# Patient Record
Sex: Female | Born: 1950
Health system: Southern US, Community
[De-identification: ages and names within clinical notes are randomized; demographics above are authoritative.]

## PROBLEM LIST (undated history)

## (undated) DIAGNOSIS — N6019 Diffuse cystic mastopathy of unspecified breast: Secondary | ICD-10-CM

## (undated) DIAGNOSIS — B009 Herpesviral infection, unspecified: Secondary | ICD-10-CM

## (undated) DIAGNOSIS — M1990 Unspecified osteoarthritis, unspecified site: Secondary | ICD-10-CM

## (undated) HISTORY — DX: Herpesviral infection, unspecified: B00.9

## (undated) HISTORY — DX: Diffuse cystic mastopathy of unspecified breast: N60.19

## (undated) HISTORY — DX: Unspecified osteoarthritis, unspecified site: M19.90

---
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
## 1987-10-14 HISTORY — PX: BREAST EXCISIONAL BIOPSY: SUR124

## 1992-10-13 DIAGNOSIS — B009 Herpesviral infection, unspecified: Secondary | ICD-10-CM

## 1992-10-13 HISTORY — DX: Herpesviral infection, unspecified: B00.9

## 1994-10-13 HISTORY — PX: OTHER SURGICAL HISTORY: SHX169

## 1995-01-12 HISTORY — PX: ABDOMINAL HYSTERECTOMY: SHX81

## 2000-10-13 HISTORY — PX: OTHER SURGICAL HISTORY: SHX169

## 2011-06-19 ENCOUNTER — Encounter: Payer: Self-pay | Admitting: Family Medicine

## 2011-06-19 ENCOUNTER — Ambulatory Visit (INDEPENDENT_AMBULATORY_CARE_PROVIDER_SITE_OTHER): Payer: BC Managed Care – PPO | Admitting: Family Medicine

## 2011-06-19 VITALS — BP 137/85 | HR 76 | Ht 65.0 in | Wt 138.0 lb

## 2011-06-19 DIAGNOSIS — Z720 Tobacco use: Secondary | ICD-10-CM | POA: Insufficient documentation

## 2011-06-19 DIAGNOSIS — M129 Arthropathy, unspecified: Secondary | ICD-10-CM

## 2011-06-19 DIAGNOSIS — Z1231 Encounter for screening mammogram for malignant neoplasm of breast: Secondary | ICD-10-CM

## 2011-06-19 DIAGNOSIS — Z23 Encounter for immunization: Secondary | ICD-10-CM

## 2011-06-19 DIAGNOSIS — M1990 Unspecified osteoarthritis, unspecified site: Secondary | ICD-10-CM | POA: Insufficient documentation

## 2011-06-19 DIAGNOSIS — Z13220 Encounter for screening for lipoid disorders: Secondary | ICD-10-CM

## 2011-06-19 DIAGNOSIS — F17200 Nicotine dependence, unspecified, uncomplicated: Secondary | ICD-10-CM

## 2011-06-19 NOTE — Progress Notes (Signed)
Subjective:    Patient ID: Alison Wilson, female    DOB: 04-18-1951, 60 y.o.   MRN: [id number]  HPI  Here to estab care. She has multiple concerns she would like to discuss today.   Has concerns about the arthritis in her fingers.  Pain over the PIPs.  She was adopted so not sure about family hx.  No swelling or redness. She does a lot of yard work.    She has been the primary care giver for her parents who are now passed away. So she admits that she hasn't really been taking care of herself fairly well. Therefore she does feel she eats a healthy diet does get some regular exercise.  Hx of breast fibroids.  Has had normal breast cyst apiration. Last mammo in 2002.   She feels she is generally in good health.  She does want to quit smoking. She uses it for stress relief.  No alcohol use. Started age 47.  Husband has now quit.  She has tried to quit about 4 times in the past.    Review of Systems  Constitutional: Negative for fever, diaphoresis and unexpected weight change.  HENT: Negative for hearing loss, sneezing, postnasal drip and tinnitus.   Eyes: Negative for visual disturbance.  Respiratory: Negative for cough and wheezing.   Cardiovascular: Negative for chest pain and palpitations.  Genitourinary: Negative for vaginal bleeding and vaginal discharge.       Occasional stress incontinence with coughing and sneezing.  Musculoskeletal: Negative for arthralgias.  Neurological: Negative for headaches.  Hematological: Negative for adenopathy. Does not bruise/bleed easily.       BP 137/85  Pulse 76  Ht 5\' 5"  (1.651 m)  Wt 138 lb (62.596 kg)  BMI 22.96 kg/m2    Allergies  Allergen Reactions  . Keflex     Past Medical History  Diagnosis Date  . Herpes 1994    Past Surgical History  Procedure Date  . Abdominal hysterectomy 01/1995    fibroid, still has her ovaries.   . Tonsillectomy and adenoidectomy 1957  . Right breast biopsy 2002    History   Social History  .  Marital Status: Married    Spouse Name: [name]    Number of Children: 2  . Years of Education: N/A   Occupational History  . Retired    Social History Main Topics  . Smoking status: Current Everyday Smoker -- 1.0 packs/day    Types: Cigarettes  . Smokeless tobacco: Not on file  . Alcohol Use: No  . Drug Use: No  . Sexually Active: Not Currently   Other Topics Concern  . Not on file   Social History Narrative  . No narrative on file    History reviewed. No pertinent family history.  [name] does not currently have medications on file.  Objective:   Physical Exam  Constitutional: She is oriented to person, place, and time. She appears well-developed and well-nourished.  HENT:  Head: Normocephalic and atraumatic.  Cardiovascular: Normal rate, regular rhythm and normal heart sounds.        No carotid bruits  Pulmonary/Chest: Effort normal and breath sounds normal.  Neurological: She is alert and oriented to person, place, and time.  Skin: Skin is warm and dry.  Psychiatric: She has a normal mood and affect. Her behavior is normal.          Assessment & Plan:  Arthritis - Recommend use Tylenol prn    tobacoo Abuse-Discussed stratgies such as  nicotine replacemen and chantix. She is most interested in the patch.  We discussed how she starts with probably a 7 mcg notch and then taper down over one to 2 months. Also recommended the weight 100 quit now program which is free.  Vaccines - Recommend Tdap today and flu shot.  Screening breast cancer- Willl schedule for mammogam/

## 2011-06-19 NOTE — Patient Instructions (Signed)
[phone]-[email or web address]-NOW We will call you with your lab work results We will call you with your mammogram appointment

## 2011-06-20 LAB — LIPID PANEL
Cholesterol: 238 mg/dL — ABNORMAL HIGH (ref 0–200)
HDL: 49 mg/dL (ref >39)
Total CHOL/HDL Ratio: 4.9 Ratio
Triglycerides: 95 mg/dL (ref <150)
VLDL: 19 mg/dL (ref 0–40)

## 2011-06-20 LAB — COMPLETE METABOLIC PANEL WITH GFR
AST: 18 U/L (ref 0–37)
Alkaline Phosphatase: 68 U/L (ref 39–117)
BUN: 14 mg/dL (ref 6–23)
Creat: 0.99 mg/dL (ref 0.50–1.10)
GFR, Est Non African American: 57 mL/min — ABNORMAL LOW (ref >60)
Potassium: 4.2 mEq/L (ref 3.5–5.3)
Total Bilirubin: 0.5 mg/dL (ref 0.3–1.2)

## 2011-06-23 ENCOUNTER — Telehealth: Payer: Self-pay | Admitting: Family Medicine

## 2011-06-23 NOTE — Telephone Encounter (Signed)
LMOM for Pt to CB 

## 2011-06-23 NOTE — Telephone Encounter (Signed)
Pt notified.  Pt was not fasting for labs.  Had 3 0z orange juice and 1/2 teaspoon sugar in coffee.  Pt has a CPE scheduled for 07-07-11 and wishes to hold off on all medications until then so she can talk with the provider.  Pt will come fasting on 07-07-11 in case the BS needs to be repeated on that date.

## 2011-06-23 NOTE — Telephone Encounter (Signed)
Call patient: Glucose is just slightly elevated in the prediabetic range. I definitely want to keep him on this and recheck her in 6 months. In the meantime he'll low sugar and low carb diet. Also her cholesterol is very high. Her LDL is 170. She really needs to start a cholesterol-lowering medication in addition to low-fat diet and regular exercise. Then recheck in 2-3 months. If she is okay with me prescribing the medication let me know.

## 2011-06-24 ENCOUNTER — Telehealth: Payer: Self-pay | Admitting: Family Medicine

## 2011-06-24 NOTE — Telephone Encounter (Signed)
OK to repeat fasting glucose. Can do here in the office with nurse visit.

## 2011-06-24 NOTE — Telephone Encounter (Signed)
Patient walk-in office advised that [facility] stated she can have a blood sugar recheck. Patient needs a lab order sent downstairs so she can go to lab tomorrow or Thursday. She did nt fas last time she had labs done and her blood sugar was high.Marland KitchenMarland Kitchen

## 2011-06-24 NOTE — Telephone Encounter (Signed)
Pt informed that she can come in for a fasting glucose level as a scheduled nurse visit here in our office.  LMOM telling the pt to call the office and sched nurse visit with the front office staff. Jarvis Newcomer, LPN [facility]

## 2011-06-24 NOTE — Telephone Encounter (Signed)
Dr. Linford Arnold obviously pt walked in the office and spoke with front staff.  Wants to repeat BS fasting.  Is this okay to order?  Will insurance cover cost?? Please advise and I'll be glad to order whatever is needed.  Pt recently had labs done on 06-19-11 and BS was 103 but pt wasn't fasting. Plan:  Routed to Dr. Marlyne Beards, LPN [facility]

## 2011-06-24 NOTE — Telephone Encounter (Signed)
Closed

## 2011-06-27 ENCOUNTER — Ambulatory Visit (INDEPENDENT_AMBULATORY_CARE_PROVIDER_SITE_OTHER): Payer: BC Managed Care – PPO | Admitting: Family Medicine

## 2011-06-27 VITALS — BP 108/73 | HR 73

## 2011-06-27 DIAGNOSIS — R7301 Impaired fasting glucose: Secondary | ICD-10-CM

## 2011-06-27 NOTE — Progress Notes (Signed)
Her to recheck glucose fasting. Had labs done where glucose was 103 but she says she wasn't fasting. Recheck today is 99. At goal. Recheck in one year.

## 2011-07-01 ENCOUNTER — Ambulatory Visit
Admission: RE | Admit: 2011-07-01 | Discharge: 2011-07-01 | Disposition: A | Discharge status: Home / Self Care | Payer: BC Managed Care – PPO | Source: Ambulatory Visit | Attending: Family Medicine | Admitting: Family Medicine

## 2011-07-01 DIAGNOSIS — Z1231 Encounter for screening mammogram for malignant neoplasm of breast: Secondary | ICD-10-CM

## 2011-07-04 ENCOUNTER — Telehealth: Payer: Self-pay | Admitting: *Deleted

## 2011-07-04 ENCOUNTER — Other Ambulatory Visit: Payer: Self-pay | Admitting: Family Medicine

## 2011-07-04 ENCOUNTER — Telehealth: Payer: Self-pay | Admitting: Family Medicine

## 2011-07-04 DIAGNOSIS — R928 Other abnormal and inconclusive findings on diagnostic imaging of breast: Secondary | ICD-10-CM

## 2011-07-04 NOTE — Telephone Encounter (Signed)
Pt called and is concerned that she received a call about her recent mammo.  Pt is calling for the result.  Can I tell the pt that she has a mass in the LT breast.  Pt understands there is a problem because a mess was left for her but she does not know any specifics.  Please advise. Jarvis Newcomer, LPN [facility]

## 2011-07-04 NOTE — Telephone Encounter (Signed)
Tell her there was a abnormal spot on the left breast.

## 2011-07-04 NOTE — Telephone Encounter (Signed)
Message copied by Wyline Beady on Fri Jul 04, 2011  2:58 PM ------      Message from: Nani Gasser D      Created: Fri Jul 04, 2011 12:37 PM       There was an abnormality  on your mammogram. They should be contacting you to schedule more testing.

## 2011-07-04 NOTE — Telephone Encounter (Signed)
Pt informed of abnormal finding in LT breast.  Going Monday afternoon for follow up of add'l testing.   Jarvis Newcomer, LPN [facility]

## 2011-07-04 NOTE — Telephone Encounter (Signed)
Left message on vm

## 2011-07-07 ENCOUNTER — Ambulatory Visit
Admission: RE | Admit: 2011-07-07 | Discharge: 2011-07-07 | Disposition: A | Discharge status: Home / Self Care | Payer: BC Managed Care – PPO | Source: Ambulatory Visit | Attending: Family Medicine | Admitting: Family Medicine

## 2011-07-07 ENCOUNTER — Encounter: Payer: Self-pay | Admitting: Family Medicine

## 2011-07-07 ENCOUNTER — Ambulatory Visit (INDEPENDENT_AMBULATORY_CARE_PROVIDER_SITE_OTHER): Payer: BC Managed Care – PPO | Admitting: Family Medicine

## 2011-07-07 VITALS — BP 157/79 | HR 68 | Ht 65.5 in | Wt 136.0 lb

## 2011-07-07 DIAGNOSIS — R928 Other abnormal and inconclusive findings on diagnostic imaging of breast: Secondary | ICD-10-CM

## 2011-07-07 DIAGNOSIS — F17200 Nicotine dependence, unspecified, uncomplicated: Secondary | ICD-10-CM

## 2011-07-07 DIAGNOSIS — E785 Hyperlipidemia, unspecified: Secondary | ICD-10-CM | POA: Insufficient documentation

## 2011-07-07 DIAGNOSIS — Z720 Tobacco use: Secondary | ICD-10-CM

## 2011-07-07 DIAGNOSIS — Z0000 Encounter for general adult medical examination without abnormal findings: Secondary | ICD-10-CM

## 2011-07-07 NOTE — Patient Instructions (Signed)
Start a regular exercise program and make sure you are eating a healthy diet Try to eat 4 servings of dairy a day or take a calcium supplement (500mg  twice a day). Your vaccines are up to date.  Call back when you are ready for your vaccines

## 2011-07-07 NOTE — Progress Notes (Signed)
  Subjective:     Alison Wilson is a 60 y.o. female and is here for a comprehensive physical exam. The patient reports problems - Has her breast f/u this afternoon.  Say sreally wants to work on her cholesterol before starting a medication. she still smokes.  No regular execise right now. .  History   Social History  . Marital Status: Married    Spouse Name: [name]    Number of Children: 2  . Years of Education: N/A   Occupational History  . Retired    Social History Main Topics  . Smoking status: Current Everyday Smoker -- 1.0 packs/day    Types: Cigarettes  . Smokeless tobacco: Not on file  . Alcohol Use: No  . Drug Use: No  . Sexually Active: Not Currently   Other Topics Concern  . Not on file   Social History Narrative  . No narrative on file   Health Maintenance  Topic Date Due  . Pap Smear  07/18/1967  . Colonoscopy  07/18/1999  . Zostavax  07/17/2009  . Influenza Vaccine  07/14/2011  . Mammogram  06/30/2013  . Tetanus/tdap  06/18/2021    The following portions of the patient's history were reviewed and updated as appropriate: allergies, current medications, past family history, past medical history, past social history and past surgical history.  Review of Systems A comprehensive review of systems was negative.   Objective:    BP 157/79  Pulse 68  Ht 5' 5.5" (1.664 m)  Wt 136 lb (61.689 kg)  BMI 22.29 kg/m2 General appearance: alert, cooperative and appears stated age Head: Normocephalic, without obvious abnormality, atraumatic Eyes: PEERLA, EOMI, conjunctiva are clear.  Ears: normal TM's and external ear canals both ears Nose: Nares normal. Septum midline. Mucosa normal. No drainage or sinus tenderness. Throat: upper partials Neck: no adenopathy, no carotid bruit, supple, symmetrical, trachea midline and thyroid not enlarged, symmetric, no tenderness/mass/nodules Back: symmetric, no curvature. ROM normal. No CVA tenderness. Lungs: clear to auscultation  bilaterally Heart: regular rate and rhythm, S1, S2 normal, no murmur, click, rub or gallop Abdomen: soft, non-tender; bowel sounds normal; no masses,  no organomegaly Extremities: extremities normal, atraumatic, no cyanosis or edema Pulses: 2+ and symmetric Skin: Skin color, texture, turgor normal. No rashes or lesions Lymph nodes: Cervical, supraclavicular, and axillary nodes normal. Neurologic: Grossly normal    Assessment:    Healthy female exam.   Plan:     See After Visit Summary for Counseling Recommendations  Start a regular exercise program and make sure you are eating a healthy diet Try to eat 4 servings of dairy a day or take a calcium supplement (500mg  twice a day). Your vaccines are up to date.  Has her follow up scheduled on Monday for her abnormal breast testing.   She says her BP is really high today because she is stressed about the breast imaging.

## 2011-07-08 ENCOUNTER — Telehealth: Payer: Self-pay | Admitting: *Deleted

## 2011-07-08 NOTE — Telephone Encounter (Signed)
Pt notified of results and MD isntructions. KJ LPN

## 2011-07-08 NOTE — Telephone Encounter (Signed)
Message copied by Lanae Crumbly on Tue Jul 08, 2011  3:38 PM ------      Message from: Nani Gasser D      Created: Mon Jul 07, 2011  8:54 PM       F/u was neg. REpeat mammo in 1 yr.

## 2011-08-04 ENCOUNTER — Ambulatory Visit: Payer: BC Managed Care – PPO

## 2011-08-12 ENCOUNTER — Ambulatory Visit (INDEPENDENT_AMBULATORY_CARE_PROVIDER_SITE_OTHER): Payer: BC Managed Care – PPO | Admitting: Family Medicine

## 2011-08-12 VITALS — BP 107/68 | HR 72 | Temp 98.3°F | Resp 20 | Ht 65.0 in | Wt 135.0 lb

## 2011-08-12 DIAGNOSIS — Z2911 Encounter for prophylactic immunotherapy for respiratory syncytial virus (RSV): Secondary | ICD-10-CM

## 2011-08-12 DIAGNOSIS — Z23 Encounter for immunization: Secondary | ICD-10-CM

## 2011-08-12 NOTE — Progress Notes (Signed)
  Subjective:    Patient ID: Alison Wilson, female    DOB: 1950-11-04, 60 y.o.   MRN: [id number]  HPI    Review of Systems     Objective:   Physical Exam        Assessment & Plan:  Pt presents today for shingles vaccine.  Pt is well today without any acute illness or fever.  Vaccine given RT upper arm sub-Q without any problems. Jarvis Newcomer, LPN [facility]

## 2011-08-12 NOTE — Patient Instructions (Signed)
Pt given VIS sheet and was given a chance to read before vaccine given to her and pt was allowed time to ask questions. Jarvis Newcomer, LPN [facility]

## 2012-09-20 ENCOUNTER — Encounter: Payer: Self-pay | Admitting: Physician Assistant

## 2012-09-20 ENCOUNTER — Ambulatory Visit (INDEPENDENT_AMBULATORY_CARE_PROVIDER_SITE_OTHER): Payer: BC Managed Care – PPO | Admitting: Physician Assistant

## 2012-09-20 VITALS — BP 139/82 | HR 74 | Temp 98.2°F | Ht 65.5 in | Wt 131.0 lb

## 2012-09-20 DIAGNOSIS — J40 Bronchitis, not specified as acute or chronic: Secondary | ICD-10-CM

## 2012-09-20 DIAGNOSIS — J069 Acute upper respiratory infection, unspecified: Secondary | ICD-10-CM

## 2012-09-20 MED ORDER — HYDROCODONE-HOMATROPINE 5-1.5 MG/5ML PO SYRP: 5.0000 mL | ORAL_SOLUTION | Freq: Every evening | ORAL | Status: DC | PRN

## 2012-09-20 MED ORDER — AMOXICILLIN-POT CLAVULANATE 875-125 MG PO TABS: 1.0000 | ORAL_TABLET | Freq: Two times a day (BID) | ORAL | Status: DC

## 2012-09-20 NOTE — Patient Instructions (Addendum)

## 2012-09-20 NOTE — Progress Notes (Signed)
  Subjective:    Patient ID: Alison Wilson, female    DOB: Mar 08, 1951, 61 y.o.   MRN: [id number]  HPI Patient is a 61 yo female who presents to the clinic with cough and sinus pressure that has been off and on since Halloween but recently gotten much worse. Pt is a smoker but has decreased to 10 cigs a day and has been transitioning to [employer]. Denies fever but has felt like she was having chills. Denies any SOB but has had a productive cough. Both ears feel very congestion and full. She has a lot of sinus pressure. She has tried Robitussin DM and other OTC cold treatments. They seemed like they were helping but she continues to get worse.   Review of Systems     Objective:   Physical Exam  Constitutional: She is oriented to person, place, and time. She appears well-developed and well-nourished.  HENT:  Head: Normocephalic and atraumatic.  Right Ear: External ear normal.  Left Ear: External ear normal.  Mouth/Throat: Oropharynx is clear and moist.       TM's clear bilaterally, no blood or pus.  Nasal turbinates are red and swollen bilaterally.   Maxillary tenderness to palpation.  Eyes: Conjunctivae normal are normal.  Neck: Normal range of motion. Neck supple.       Bilateral anterior cervical lymph nodes are swollen.  Cardiovascular: Normal rate, regular rhythm and normal heart sounds.   Pulmonary/Chest: Effort normal.       COARSE breath sounds bilaterally. Mucus with cough in office.   Lymphadenopathy:    She has cervical adenopathy.  Neurological: She is alert and oriented to person, place, and time.  Skin: Skin is warm and dry.  Psychiatric: She has a normal mood and affect. Her behavior is normal.          Assessment & Plan:  Bronchitis/sinusitis/URI- Discussed with pt wanted to give Zpak. Pt did not want to try anything new and Augmentin has worked in the past. Gave augmentin for 10 days. Symptomatic care given. Encouraged pt to use Mucinex DM next time. Increase Vitamin C  and Zinc. Hycodan was given for cough at bedtime. Call if not improving. I discussed with patient that there is most likely a component of COPD since a long time smoker. I would like to do spirometry at some point to see if she would benefit from daily treatment. Today she is not willing use inhaler. IN the past ABx have cleared up flare. If patient does not improve will consider steroid.

## 2012-10-28 ENCOUNTER — Encounter: Payer: Self-pay | Admitting: Family Medicine

## 2012-10-28 ENCOUNTER — Ambulatory Visit (INDEPENDENT_AMBULATORY_CARE_PROVIDER_SITE_OTHER): Payer: BC Managed Care – PPO | Admitting: Family Medicine

## 2012-10-28 ENCOUNTER — Ambulatory Visit (INDEPENDENT_AMBULATORY_CARE_PROVIDER_SITE_OTHER): Payer: BC Managed Care – PPO

## 2012-10-28 VITALS — BP 108/64 | HR 74 | Resp 16 | Wt 131.0 lb

## 2012-10-28 DIAGNOSIS — J438 Other emphysema: Secondary | ICD-10-CM

## 2012-10-28 DIAGNOSIS — R0989 Other specified symptoms and signs involving the circulatory and respiratory systems: Secondary | ICD-10-CM

## 2012-10-28 DIAGNOSIS — R059 Cough, unspecified: Secondary | ICD-10-CM

## 2012-10-28 DIAGNOSIS — Z720 Tobacco use: Secondary | ICD-10-CM

## 2012-10-28 DIAGNOSIS — F17200 Nicotine dependence, unspecified, uncomplicated: Secondary | ICD-10-CM

## 2012-10-28 DIAGNOSIS — R1013 Epigastric pain: Secondary | ICD-10-CM

## 2012-10-28 DIAGNOSIS — J309 Allergic rhinitis, unspecified: Secondary | ICD-10-CM

## 2012-10-28 DIAGNOSIS — Z87891 Personal history of nicotine dependence: Secondary | ICD-10-CM

## 2012-10-28 DIAGNOSIS — R05 Cough: Secondary | ICD-10-CM

## 2012-10-28 DIAGNOSIS — E785 Hyperlipidemia, unspecified: Secondary | ICD-10-CM

## 2012-10-28 DIAGNOSIS — Z131 Encounter for screening for diabetes mellitus: Secondary | ICD-10-CM

## 2012-10-28 DIAGNOSIS — J40 Bronchitis, not specified as acute or chronic: Secondary | ICD-10-CM

## 2012-10-28 MED ORDER — FLUTICASONE PROPIONATE 50 MCG/ACT NA SUSP: 2.0000 | Freq: Every day | NASAL | Status: DC

## 2012-10-28 NOTE — Progress Notes (Signed)
Subjective:    Patient ID: Alison Wilson, female    DOB: April 29, 1951, 62 y.o.   MRN: [id number]  HPI Dx bronchitis x 5 weeks ago.  Was on amox for 7 days. Didn't complete her antibiotic bc started having vaginitis sxs.  Says that has resolved.  Says the cough syrup helped as well.  80 % better.  Still having some right ear pressure.  Still smoking.  Having some epigastric pain that is coming and going.  Has been eating a lot of peppermints. Pain can last 1-5 minutes.  Says hasn't had any heartburn sxs.  No nausea.  Has been burping more lately.  Says pressure is better when she burps.  Stomach pain is about 2 times per week.  He still having some intermittent nasal congestion as well. She also notices she was lifting some boxes yesterday and actually put her abdomen and then now has a sore spot. No bruising. She has not noticed any pattern to her about pain. No worsening or alleviating symptoms.  Hyperpididemia- Want to have her levels recheck. Wants to be tested for DM as well.    Review of Systems BP 108/64  Pulse 74  Resp 16  Wt 131 lb (59.421 kg)  SpO2 97%    Allergies  Allergen Reactions  . Cephalexin Rash    Past Medical History  Diagnosis Date  . Herpes 1994  . Fibrocystic breast     Past Surgical History  Procedure Date  . Abdominal hysterectomy 01/1995    fibroid, still has her ovaries.   . Tonsillectomy and adenoidectomy 1957  . Right breast biopsy 2002  . Cyst on left ovary removed 1996    History   Social History  . Marital Status: Married    Spouse Name: [name]    Number of Children: 2  . Years of Education: N/A   Occupational History  . Retired    Social History Main Topics  . Smoking status: Current Every Day Smoker -- 1.0 packs/day    Types: Cigarettes  . Smokeless tobacco: Not on file  . Alcohol Use: No  . Drug Use: No  . Sexually Active: Not Currently   Other Topics Concern  . Not on file   Social History Narrative  . No narrative on file     No family history on file.  Outpatient Encounter Prescriptions as of 10/28/2012  Medication Sig Dispense Refill  . Calcium Carbonate (CALCIUM 500 PO) Take by mouth.        . Glucosamine-Chondroit-Vit C-Mn (GLUCOSAMINE CHONDR 1500 COMPLX PO) Take by mouth.        . vitamin C (ASCORBIC ACID) 500 MG tablet Take 500 mg by mouth daily.        . fluticasone (FLONASE) 50 MCG/ACT nasal spray Place 2 sprays into the nose daily.  16 g  1  . [DISCONTINUED] amoxicillin-clavulanate (AUGMENTIN) 875-125 MG per tablet Take 1 tablet by mouth 2 (two) times daily. For 10 days  20 tablet  0  . [DISCONTINUED] HYDROcodone-homatropine (HYCODAN) 5-1.5 MG/5ML syrup Take 5 mLs by mouth at bedtime as needed for cough.  120 mL  0          Objective:   Physical Exam  Constitutional: She is oriented to person, place, and time. She appears well-developed and well-nourished.  HENT:  Head: Normocephalic and atraumatic.  Right Ear: External ear normal.  Left Ear: External ear normal.  Nose: Nose normal.  Mouth/Throat: Oropharynx is clear and moist.  TMs and canals are clear.   Eyes: Conjunctivae normal and EOM are normal. Pupils are equal, round, and reactive to light.  Neck: Neck supple. No thyromegaly present.  Cardiovascular: Normal rate, regular rhythm and normal heart sounds.   Pulmonary/Chest: Effort normal and breath sounds normal. She has no wheezes.  Abdominal: Soft. Bowel sounds are normal. She exhibits no distension and no mass. There is tenderness. There is no rebound and no guarding.       Tender over the epigastrum.  Lymphadenopathy:    She has no cervical adenopathy.  Neurological: She is alert and oriented to person, place, and time.  Skin: Skin is warm and dry.  Psychiatric: She has a normal mood and affect.          Assessment & Plan:  Bronchitis - Much Improved. She is at least 80% better.  Continue current regimen. Explained her will take a couple more weeks for her to feel  completely back to herself. She still having some mild sinus symptoms I think are consistent with allergic rhinitis.  AR- Will treat with nasal steroid spray.  Call if not better in one week. Consider a sinus infection if not improvement.    tob abuse - Still smoking. Discussed cessation.    Hyperlipidemia - Dueto recheck lipids. Lab slip given today.  Will screen for diabetes.   Epigastric pain - I think likely gastritis. Will try sample PPI for one week. If not helping then consider eval for Gallbladder with [place].  Will check liver enzymes as well. She's not having any significant nausea and her pain is primary epigastric. Certainly her smoking and eating a lot of peppermints, puts her at high risk of gastritis.

## 2012-10-28 NOTE — Patient Instructions (Addendum)
Work on smoking cessation.  Call if not better on the dexilant.  Take one before breakfast daily. Call stomach pain is not better in one week.  Call if sinuses are not better in one week with the prescription spray.

## 2012-10-29 ENCOUNTER — Telehealth: Payer: Self-pay | Admitting: *Deleted

## 2012-10-29 LAB — LIPID PANEL
Cholesterol: 236 mg/dL — ABNORMAL HIGH (ref 0–200)
Triglycerides: 67 mg/dL (ref <150)

## 2012-10-29 LAB — COMPLETE METABOLIC PANEL WITH GFR
ALT: 21 U/L (ref 0–35)
AST: 20 U/L (ref 0–37)
Albumin: 4.5 g/dL (ref 3.5–5.2)
BUN: 11 mg/dL (ref 6–23)
CO2: 24 mEq/L (ref 19–32)
Calcium: 10.5 mg/dL (ref 8.4–10.5)
Chloride: 106 mEq/L (ref 96–112)
Creat: 0.91 mg/dL (ref 0.50–1.10)
GFR, Est African American: 78 mL/min
Potassium: 4.7 mEq/L (ref 3.5–5.3)

## 2012-10-29 LAB — HEMOGLOBIN A1C
Hgb A1c MFr Bld: 5.6 % (ref <5.7)
Mean Plasma Glucose: 114 mg/dL (ref <117)

## 2012-10-29 NOTE — Telephone Encounter (Signed)
Pt called & says she's going hold on the dexilant samples until next week until you know what's wrong with her.

## 2012-11-01 ENCOUNTER — Other Ambulatory Visit: Payer: Self-pay | Admitting: Family Medicine

## 2012-11-01 MED ORDER — PRAVASTATIN SODIUM 20 MG PO TABS: 20.0000 mg | ORAL_TABLET | Freq: Every day | ORAL | Status: DC

## 2012-11-02 ENCOUNTER — Telehealth: Payer: Self-pay | Admitting: *Deleted

## 2012-11-02 MED ORDER — DEXLANSOPRAZOLE 30 MG PO CPDR: 30.0000 mg | DELAYED_RELEASE_CAPSULE | Freq: Every day | ORAL | Status: DC

## 2012-11-02 NOTE — Telephone Encounter (Signed)
Left detailed message.   

## 2012-11-02 NOTE — Telephone Encounter (Signed)
Alison Wilson took Dexilant 60 mg and 20 minutes later she had trouble breathing and dry mouth. She wants to know if she can reduce to 30 mg. [facility]. She also agreed to take pravastatin.

## 2012-11-02 NOTE — Addendum Note (Signed)
Addended by: Nani Gasser D on: 11/02/2012 01:05 PM   Modules accepted: Orders

## 2012-11-02 NOTE — Telephone Encounter (Signed)
rx sent for 30mg . If has SOB with repeated dose then don't take anymore.

## 2012-11-02 NOTE — Telephone Encounter (Signed)
Message copied by Chalmers Cater on Tue Nov 02, 2012 11:43 AM ------      Message from: Nani Gasser D      Created: Mon Nov 01, 2012  5:32 PM       We can try pravastin. It is a weaker statin. Can take at bedtime daily. Dexilant can cause diarrhea and nausea rarely.

## 2012-11-02 NOTE — Telephone Encounter (Signed)
Pt calls and states that she started the Dexilant- took her first one at 4:12am this morning and is doing ok but her nasal passages seem to swell some and a little SOB but is feeling better now. Pt asked if the Dexilant came in a 30mg  dose and if so could she have this? Also states she read on leaflet that magnesium level should be checked while on this- is this something she needs to do or not.

## 2012-11-03 ENCOUNTER — Telehealth: Payer: Self-pay | Admitting: *Deleted

## 2012-11-03 ENCOUNTER — Other Ambulatory Visit: Payer: Self-pay | Admitting: Family Medicine

## 2012-11-03 ENCOUNTER — Other Ambulatory Visit: Payer: BC Managed Care – PPO | Admitting: Family Medicine

## 2012-11-03 DIAGNOSIS — R1013 Epigastric pain: Secondary | ICD-10-CM

## 2012-11-03 LAB — POC HEMOCCULT BLD/STL (HOME/3-CARD/SCREEN)
Card #3 Fecal Occult Blood, POC: NEGATIVE
Fecal Occult Blood, POC: NEGATIVE

## 2012-11-03 NOTE — Telephone Encounter (Signed)
Pt.notified

## 2012-11-03 NOTE — Telephone Encounter (Signed)
She can try Prilosec over-the-counter instead. Once a day. 30 minutes before breakfast.

## 2012-11-03 NOTE — Telephone Encounter (Signed)
Pt calls back again and states that the Dexilant 30mg  cost too much she can not afford it 175.00. Still having some of the effects of the Dexilant 60mg  from yesterday but is getting better. Pt wants to know if there is anything OTC that she can take to help her stomach

## 2012-11-05 ENCOUNTER — Encounter: Payer: Self-pay | Admitting: Family Medicine

## 2012-11-05 ENCOUNTER — Ambulatory Visit (INDEPENDENT_AMBULATORY_CARE_PROVIDER_SITE_OTHER): Payer: BC Managed Care – PPO | Admitting: Family Medicine

## 2012-11-05 VITALS — BP 109/65 | HR 70 | Resp 18 | Wt 130.0 lb

## 2012-11-05 DIAGNOSIS — J329 Chronic sinusitis, unspecified: Secondary | ICD-10-CM

## 2012-11-05 DIAGNOSIS — R918 Other nonspecific abnormal finding of lung field: Secondary | ICD-10-CM

## 2012-11-05 DIAGNOSIS — R1013 Epigastric pain: Secondary | ICD-10-CM

## 2012-11-05 DIAGNOSIS — R319 Hematuria, unspecified: Secondary | ICD-10-CM

## 2012-11-05 DIAGNOSIS — F17200 Nicotine dependence, unspecified, uncomplicated: Secondary | ICD-10-CM

## 2012-11-05 DIAGNOSIS — R9389 Abnormal findings on diagnostic imaging of other specified body structures: Secondary | ICD-10-CM

## 2012-11-05 DIAGNOSIS — Z720 Tobacco use: Secondary | ICD-10-CM

## 2012-11-05 LAB — POCT URINALYSIS DIPSTICK
Bilirubin, UA: NEGATIVE
Glucose, UA: NEGATIVE
Leukocytes, UA: NEGATIVE
Nitrite, UA: NEGATIVE
Urobilinogen, UA: 0.2

## 2012-11-05 MED ORDER — AZITHROMYCIN 250 MG PO TABS: ORAL_TABLET | ORAL | Status: DC

## 2012-11-05 NOTE — Addendum Note (Signed)
Addended by: Chalmers Cater on: 11/05/2012 04:47 PM   Modules accepted: Orders

## 2012-11-05 NOTE — Progress Notes (Signed)
  Subjective:    Patient ID: Alison Wilson, female    DOB: 1951-05-03, 62 y.o.   MRN: [id number]  HPI Still having sig nasal congestion.  Says her chest is much better and says her left maxillary sinuses are still painful.  Was on amox and says her ear is better but sinuses not completely clear.  Her cough is overall better as well. She did complete the full course of Augmentin No fevers.  She is still smoking some but has dramatically cut back since she's been sick.  Epigastric discomfort. Stopped her peppermints. Tried Dexilant but didn't feel well on it. Also stopped her OTC supplements just to be sure that these were causing some of her discomfort..  Bought OTC prilosec but hasn't started it.  She's really primarily just make some dietary changes. She does feel little better but still having occasional problems with epigastric discomfort and nausea.   Review of Systems     Objective:   Physical Exam  Constitutional: She is oriented to person, place, and time. She appears well-developed and well-nourished.  HENT:  Head: Normocephalic and atraumatic.  Right Ear: External ear normal.  Left Ear: External ear normal.  Nose: Nose normal.  Mouth/Throat: Oropharynx is clear and moist.       TMs and canals are clear.   Eyes: Conjunctivae normal and EOM are normal. Pupils are equal, round, and reactive to light.  Neck: Neck supple. No thyromegaly present.  Cardiovascular: Normal rate, regular rhythm and normal heart sounds.   Pulmonary/Chest: Effort normal and breath sounds normal. She has no wheezes.  Lymphadenopathy:    She has no cervical adenopathy.  Neurological: She is alert and oriented to person, place, and time.  Skin: Skin is warm and dry.  Psychiatric: She has a normal mood and affect.          Assessment & Plan:  Sinusitis - Not completley resolved, so will tx with azithromycin. Call if not better in one week. If not completely improving consider that she could have chronic  sinusitis this point and may need to treat for a longer course.  Epigastric pain - Discussed options. We discussed getting an ultrasound gallbladder if she wasn't improving significantly with of Dexilant. Unfortunately she didn't tolerate dexilant. She wants to move forward with [place] to evaluate her GB.  Will start the prilosec OTC.  She says that if she starts to Prilosec and feels tremendoulys better then she will cancel the appointment for the ultrasound.  She wants to hold off on her flu shot.  She says she has a dinner party to attend and doesn't want to be sick for it so she says she will try to get her flu shot sometime next week.  Tobacco abuse-continue to encourage cessation. She did have some questions about nicotine replacement including patches versus the gum. I do think that one of these options would be safe for her. I recommend try using the gum.  Abnormal chest x-ray-they did see changes of emphysema on her chest x-ray. I do recommend scheduling her for spirometry when she is over this acute illness. Also strongly encourage smoking cessation.  She did request a repeat run a urine today. She was not specifically having any urinary symptoms there. It was positive for blood. She's postmenopausal. This will send for culture. If negative then consider repeating urine in 2 weeks to make sure that the microscope hematuria has resolved.

## 2012-11-05 NOTE — Patient Instructions (Addendum)
We will schedule your [place] for next week.   Take prilosec daily 30 min before breakfast.

## 2012-11-08 ENCOUNTER — Other Ambulatory Visit: Payer: Self-pay | Admitting: Family Medicine

## 2012-11-08 ENCOUNTER — Telehealth: Payer: Self-pay | Admitting: Family Medicine

## 2012-11-08 ENCOUNTER — Ambulatory Visit (INDEPENDENT_AMBULATORY_CARE_PROVIDER_SITE_OTHER): Payer: BC Managed Care – PPO

## 2012-11-08 DIAGNOSIS — R1013 Epigastric pain: Secondary | ICD-10-CM

## 2012-11-08 DIAGNOSIS — R112 Nausea with vomiting, unspecified: Secondary | ICD-10-CM

## 2012-11-08 DIAGNOSIS — K769 Liver disease, unspecified: Secondary | ICD-10-CM

## 2012-11-08 LAB — URINE CULTURE: Colony Count: >=100000

## 2012-11-08 MED ORDER — SULFAMETHOXAZOLE-TRIMETHOPRIM 800-160 MG PO TABS: 1.0000 | ORAL_TABLET | Freq: Two times a day (BID) | ORAL | Status: DC

## 2012-11-09 ENCOUNTER — Telehealth: Payer: Self-pay

## 2012-11-09 ENCOUNTER — Other Ambulatory Visit: Payer: Self-pay | Admitting: Family Medicine

## 2012-11-09 DIAGNOSIS — R935 Abnormal findings on diagnostic imaging of other abdominal regions, including retroperitoneum: Secondary | ICD-10-CM

## 2012-11-09 DIAGNOSIS — K769 Liver disease, unspecified: Secondary | ICD-10-CM

## 2012-11-09 NOTE — Telephone Encounter (Signed)
Order placed

## 2012-11-09 NOTE — Telephone Encounter (Signed)
Alison Wilson is ok with having the MRI liver with and without contrast.

## 2012-11-11 ENCOUNTER — Encounter: Payer: Self-pay | Admitting: Family Medicine

## 2012-11-11 ENCOUNTER — Ambulatory Visit (INDEPENDENT_AMBULATORY_CARE_PROVIDER_SITE_OTHER): Payer: BC Managed Care – PPO | Admitting: Family Medicine

## 2012-11-11 VITALS — BP 118/65 | HR 70 | Temp 98.2°F | Wt 132.0 lb

## 2012-11-11 DIAGNOSIS — H9209 Otalgia, unspecified ear: Secondary | ICD-10-CM

## 2012-11-11 DIAGNOSIS — R1013 Epigastric pain: Secondary | ICD-10-CM

## 2012-11-11 DIAGNOSIS — F17200 Nicotine dependence, unspecified, uncomplicated: Secondary | ICD-10-CM

## 2012-11-11 DIAGNOSIS — Z720 Tobacco use: Secondary | ICD-10-CM

## 2012-11-11 DIAGNOSIS — H9202 Otalgia, left ear: Secondary | ICD-10-CM

## 2012-11-11 DIAGNOSIS — J329 Chronic sinusitis, unspecified: Secondary | ICD-10-CM

## 2012-11-11 DIAGNOSIS — N390 Urinary tract infection, site not specified: Secondary | ICD-10-CM

## 2012-11-11 NOTE — Progress Notes (Signed)
Subjective:    Patient ID: Alison Wilson, female    DOB: 1951-01-20, 62 y.o.   MRN: [id number]  HPI  She is here today to followup on multiple issues. She is here with a friend of hers for support. She was recently seen for Sinusitis  - Says she still feels achey all over, like the flu. She has ST, left ear pain.  [facility] feels like is getting some low grade temps. She completed her zpack yesterday. She does feel her sinus symptoms have improved slightly.  UTI - she was also having abdominal pain lasting. Is primarily epigastric. We did send her urine for culture because it was positive for hematuria. It did grow out Enterobacter which was sensitive to Bactrim. We called this prescription in over the phone. She has taken the medication the last 3 days and one more dose this evening. She feels like the antibiotics may be causing her dry mouth as well as some generalized aching. We also scheduled her for an ultrasound to evaluate her gallbladder. There were no stones but she did have a polyp. She did have evidence of possible hemangiomas of the liver. Thus I recommend an MRI for further evaluation.  Says he epigastric pain is better, but she still having some pain in the lower chest and around onto the right side of her chest. She says she still having some suprapubic pain and discomfort as well. This was not original complaint when I saw her a week ago but says it has been bothering her for the last couple of days. She's not sure that he had a lot of urinary tract infection is completely cleared things up because she still has discomfort. She's been trying to eat a very bland diet for the last week. She has been taking a PPI appropriately.   Review of Systems     Objective:   Physical Exam  Constitutional: She is oriented to person, place, and time. She appears well-developed and well-nourished.  HENT:  Head: Normocephalic and atraumatic.  Right Ear: External ear normal.  Left Ear: External ear  normal.  Nose: Nose normal.  Mouth/Throat: Oropharynx is clear and moist.       TMs and canals are clear.   Eyes: Conjunctivae normal and EOM are normal. Pupils are equal, round, and reactive to light.  Neck: Neck supple. No thyromegaly present.  Cardiovascular: Normal rate, regular rhythm and normal heart sounds.   Pulmonary/Chest: Effort normal and breath sounds normal. She has no wheezes.  Abdominal: Soft. Bowel sounds are normal. She exhibits no distension and no mass. There is tenderness. There is no rebound and no guarding.       Tender over the epigastrium with deep palpation. Otherwise nontender. No masses.  Musculoskeletal: She exhibits no edema.  Lymphadenopathy:    She has no cervical adenopathy.  Neurological: She is alert and oriented to person, place, and time.  Skin: Skin is warm and dry.  Psychiatric: She has a normal mood and affect.          Assessment & Plan:  Sinusitis-I expect that she will improve in the next week. She did complete her course of azithromycin. Last dose was yesterday. Call if she suddenly gets worse or spikes a fever. Some of the nasal symptoms may take a few more days to completely resolve somewhat like to see her she does over the next 5-7 days. I do think some generalized aching she is describing is most likely related to the antibiotics.  Left otalgia-your exam was still normal today. Of symptoms is related to her sinuses. Continue Flonase nasal spray.  UTI - complete course of Bactrim. She has one dose left. Repeat UA on Friday of next week to make sure that the infection is cleared and make sure that the hematuria has resolved as well.  Epigastric pain-still unclear etiology-her ultrasound was fairly normal except for polyp which should not be causing her symptoms. We are following up on the abnormal scan to determine if the lesions on her liver are truly hemangiomas. The patient was very confused today and thought that I was talking about an  infection of her liver. I tried to reassure her and to clear this up at least 2 times in the conversation today. MRI scheduled for Saturday. If this is normal and reassuring and she persists to have epigastric and right upper quadrant pain or for her to gastroneurology for further evaluation. She might benefit from a HIDA scan at that point in time.  Tobacco abuse-it actually so far she's doing great. She's cut back on her smoking since I last spoke with her.  Time spent 25 min, >50% spent in counseling about her infections and her liver fidnidn on [place] and her epigatic pain

## 2012-11-13 ENCOUNTER — Ambulatory Visit (HOSPITAL_BASED_OUTPATIENT_CLINIC_OR_DEPARTMENT_OTHER): Admission: RE | Admit: 2012-11-13 | Payer: BC Managed Care – PPO | Source: Ambulatory Visit

## 2012-11-15 ENCOUNTER — Telehealth: Payer: Self-pay | Admitting: *Deleted

## 2012-11-15 NOTE — Telephone Encounter (Signed)
Pt calls and states that she thinks she is passing kidney stone last night and today. Having some right sided flank pain and back pain right sided seeing some discoloring to urine and thinks its blood. Been wearing a pad and yesterday seen speckles on the pad and seen this this morning. Urinated into cup and seen like little grains of sand in the cup. Instructed pt to make sure drinking plenty of water to help keep the kidneys flushed. Does have appt Friday here. Scheduled for MRI tomorrow. Instructed pt if develops fever, chills, sweats or pain gets worse to let us know or go to ED if office is closed.

## 2012-11-16 ENCOUNTER — Ambulatory Visit (HOSPITAL_BASED_OUTPATIENT_CLINIC_OR_DEPARTMENT_OTHER)
Admission: RE | Admit: 2012-11-16 | Discharge: 2012-11-16 | Disposition: A | Discharge status: Home / Self Care | Payer: BC Managed Care – PPO | Source: Ambulatory Visit | Attending: Family Medicine | Admitting: Family Medicine

## 2012-11-16 DIAGNOSIS — R935 Abnormal findings on diagnostic imaging of other abdominal regions, including retroperitoneum: Secondary | ICD-10-CM | POA: Insufficient documentation

## 2012-11-16 DIAGNOSIS — K7689 Other specified diseases of liver: Secondary | ICD-10-CM | POA: Insufficient documentation

## 2012-11-16 DIAGNOSIS — K769 Liver disease, unspecified: Secondary | ICD-10-CM

## 2012-11-16 MED ORDER — GADOBENATE DIMEGLUMINE 529 MG/ML IV SOLN: 12.0000 mL | Freq: Once | INTRAVENOUS | Status: AC | PRN

## 2012-11-17 ENCOUNTER — Other Ambulatory Visit: Payer: Self-pay | Admitting: Family Medicine

## 2012-11-17 DIAGNOSIS — R109 Unspecified abdominal pain: Secondary | ICD-10-CM

## 2012-11-18 ENCOUNTER — Ambulatory Visit (INDEPENDENT_AMBULATORY_CARE_PROVIDER_SITE_OTHER): Payer: BC Managed Care – PPO | Admitting: Family Medicine

## 2012-11-18 ENCOUNTER — Ambulatory Visit: Payer: BC Managed Care – PPO

## 2012-11-18 ENCOUNTER — Telehealth: Payer: Self-pay | Admitting: *Deleted

## 2012-11-18 VITALS — Temp 98.2°F

## 2012-11-18 DIAGNOSIS — N390 Urinary tract infection, site not specified: Secondary | ICD-10-CM

## 2012-11-18 LAB — POCT URINALYSIS DIPSTICK
Bilirubin, UA: NEGATIVE
Protein, UA: NEGATIVE
Spec Grav, UA: 1.025
pH, UA: 5.5

## 2012-11-18 MED ORDER — NYSTATIN 100000 UNIT/ML MT SUSP: 500000.0000 [IU] | Freq: Four times a day (QID) | OROMUCOSAL | Status: DC

## 2012-11-18 NOTE — Telephone Encounter (Signed)
Ok to call in nystatin swish and swallow if feels she is getting thrush.

## 2012-11-18 NOTE — Progress Notes (Signed)
  Subjective:    Patient ID: Alison Wilson, female    DOB: 1951-05-04, 62 y.o.   MRN: [id number]  HPI    Review of Systems     Objective:   Physical Exam        Assessment & Plan:  Here for repeat UA after completing antibiotics. UA  Is pos so will send for cultures. She is still still haivn some "kidney pina" and pelvic discomfort.  Nani Gasser, MD

## 2012-11-18 NOTE — Telephone Encounter (Signed)
Pt calls & states that she has white sores on her tongue & mouth.  Has been on antibiotics.  Can we send her something for this to her pharmacy?  Please advise

## 2012-11-22 ENCOUNTER — Encounter: Payer: Self-pay | Admitting: Family Medicine

## 2012-11-22 ENCOUNTER — Ambulatory Visit: Payer: BC Managed Care – PPO | Admitting: Family Medicine

## 2012-11-22 ENCOUNTER — Ambulatory Visit (INDEPENDENT_AMBULATORY_CARE_PROVIDER_SITE_OTHER): Payer: BC Managed Care – PPO | Admitting: Family Medicine

## 2012-11-22 VITALS — BP 124/69 | HR 75 | Wt 128.0 lb

## 2012-11-22 DIAGNOSIS — R932 Abnormal findings on diagnostic imaging of liver and biliary tract: Secondary | ICD-10-CM

## 2012-11-22 DIAGNOSIS — R319 Hematuria, unspecified: Secondary | ICD-10-CM

## 2012-11-22 DIAGNOSIS — D1803 Hemangioma of intra-abdominal structures: Secondary | ICD-10-CM

## 2012-11-22 DIAGNOSIS — R1013 Epigastric pain: Secondary | ICD-10-CM

## 2012-11-22 NOTE — Progress Notes (Signed)
  Subjective:    Patient ID: Alison Wilson, female    DOB: 1950/11/01, 62 y.o.   MRN: [id number]  HPI Here to review her MRI results of her liver. Has question about this.  She's very unhappy about how the message was delivered her. She says that she was told that the scan was normal. In fact it was not completely normal. She was very disgruntled about this. He also wanted to discuss the lesion that was slightly atypical. She did get a copy of her report review.  GERD - Some improvement on OTC prilosec but stil not resolved. Still has some rawness in the epigastrum as well.  Not taking her prilosec on an empty stomach. She is taking it with bread.    A repeat urine culture was negative. The she still having a lot of pelvic discomfort. It actually feels a little bit better today. She is a smoker.  Review of Systems     Objective:   Physical Exam  Constitutional: She is oriented to person, place, and time. She appears well-developed and well-nourished.  HENT:  Head: Normocephalic and atraumatic.  Eyes: Conjunctivae and EOM are normal.  Cardiovascular: Normal rate.   Pulmonary/Chest: Effort normal.  Neurological: She is alert and oriented to person, place, and time.  Skin: Skin is dry. No pallor.  Psychiatric: She has a normal mood and affect. Her behavior is normal.          Assessment & Plan:  Liver hemangiomas - Gave reassurance.  Has one lesion, the largest, that is atypical so recommend repeat MRI in 3-6 months.  I tried to give her as much reassurance as possible that I do think this is likely still a hemangioma that we do need to followup with an MRI to make sure that it is stable.  GERD- Not well controlled on PPI, Has appt wiht GI tomorrow AM. Continue her prilosec for now, I encouraged her to take it on into stomach about 30 minutes before meal. Do not take with food as it does affect the effectiveness of the drug. She wants and if she can stop her statin for a few days to see if  it makes a difference in her stomach discomfort. I think certainly skipping a couple days is reasonable. She is Re: stop her over-the-counter supplements as well.  Hematuria-initially she did have a urinary tract infection. We treated her. She came in for repeat urine last week to confirm that a clear. The repeat culture was negative which is fantastic. But she still had some hematuria. Repeat urine today..    Time spent 25 minutes, greater than 50% spent in counseling for her test results, reflux, and hematuria.

## 2012-11-24 ENCOUNTER — Ambulatory Visit (INDEPENDENT_AMBULATORY_CARE_PROVIDER_SITE_OTHER): Payer: BC Managed Care – PPO | Admitting: Family Medicine

## 2012-11-24 ENCOUNTER — Encounter: Payer: Self-pay | Admitting: Family Medicine

## 2012-11-24 VITALS — BP 126/79 | HR 74 | Temp 98.3°F | Wt 128.0 lb

## 2012-11-24 DIAGNOSIS — A499 Bacterial infection, unspecified: Secondary | ICD-10-CM

## 2012-11-24 DIAGNOSIS — J329 Chronic sinusitis, unspecified: Secondary | ICD-10-CM

## 2012-11-24 DIAGNOSIS — K219 Gastro-esophageal reflux disease without esophagitis: Secondary | ICD-10-CM

## 2012-11-24 MED ORDER — DOXYCYCLINE HYCLATE 100 MG PO TABS: ORAL_TABLET | ORAL | Status: AC

## 2012-11-24 NOTE — Progress Notes (Signed)
CC: Alison Wilson is a 62 y.o. female is here for Sore Throat   Subjective: HPI:  Patient complains of facial pressure located above and below the left eye. This has been present since around the 30th of January, the day after stopping a Z-Pak. He is getting worse in a daily basis. Is described as a pressure and discomfort that is nonradiating. Moderate in severity. Nothing makes it better or worse. No interventions as of yet. The past 2 - 3 days it has been accompanied by increasing nasal congestion on the left more than right. Associated with a sore throat with a sensation of dripping down the back of her throat. Denies cough, shortness of breath, fevers, chills, motor or sensory disturbances, skin changes at the site of the discomfort. Denies trouble swallowing nor pain with movement of the neck.  She would like to discuss in length recent workup of GERD and whether or not I agree with was been done so far.  Chart was reviewed which aligns with her report of recent imaging and lab work.  She believes she was started on Prilosec by her GI physician yesterday but is still having a mild burning sensation that starts in her epigastric region and radiates behind the sternum. It is not associated with exertion. It is worse soon after meals. Nothing else makes better or worse. Discomfort is described as mild in severity. She denies right upper quadrant pain, skin or scleral discoloration, vomiting, nausea, back pain, abdominal pain elsewhere. Denies constipation or diarrhea    Review Of Systems Outlined In HPI  Past Medical History  Diagnosis Date  . Herpes 1994  . Fibrocystic breast      No family history on file.   History  Substance Use Topics  . Smoking status: Current Every Day Smoker -- 1.00 packs/day    Types: Cigarettes  . Smokeless tobacco: Not on file  . Alcohol Use: No     Objective: Filed Vitals:   11/24/12 1024  BP: 126/79  Pulse: 74  Temp: 98.3 F (36.8 C)    General:  Alert and Oriented, No Acute Distress HEENT: Pupils equal, round, reactive to light. Conjunctivae clear.  External ears unremarkable, canals clear with intact TMs with appropriate landmarks.  Middle ear appears open without effusion. Boggy and erythematous inferior turbinates with moderate mucoid discharge bilaterally.  Moist mucous membranes, pharynx without inflammation nor lesions.  Neck with shotty lymphadenopathy in the left. Left frontal and maxillary sinus tenderness to percussion Lungs: Clear to auscultation bilaterally, no wheezing/ronchi/rales.  Comfortable work of breathing. Good air movement. Cardiac: Regular rate and rhythm. Normal S1/S2.  No murmurs, rub soft and nontender Extremities: No peripheral edema.  Strong peripheral pulses.  Mental Status: No depression, anxiety, nor agitation. Skin: Warm and dry.  Assessment & Plan: Alison Wilson was seen today for sore throat.  Diagnoses and associated orders for this visit:  Bacterial sinusitis - doxycycline (VIBRA-TABS) 100 MG tablet; One by mouth twice a day for ten days.  GERD (gastroesophageal reflux disease)    Bacterial sinusitis: Due to rebound phenomenon to start doxycycline, restart nasal steroid, Alka-Seltzer cold and sinus when necessary. Encouraged her to use nasal saline sprays when necessary. She agrees to avoid nonsteroidal anti-inflammatories due to GERD and suspicion of gastritis per GI Dr. GERD: Discussed that if Prilosec was the medication she was prescribed he would unlikely reach full efficacy at this time, she can use ranitidine 150 mg twice a day as needed until Prilosec reaches full effect.  25 minutes spent face-to-face during visit today of which at least 50% was counseling or coordinating care regarding bacterial sinusitis, GERD.   Return if symptoms worsen or fail to improve.

## 2012-11-25 ENCOUNTER — Other Ambulatory Visit: Payer: Self-pay | Admitting: Family Medicine

## 2012-11-25 LAB — URINALYSIS, MICROSCOPIC ONLY
Casts: NONE SEEN
Crystals: NONE SEEN
Squamous Epithelial / LPF: NONE SEEN

## 2012-11-25 MED ORDER — FLUCONAZOLE 150 MG PO TABS: 150.0000 mg | ORAL_TABLET | Freq: Every day | ORAL | Status: DC

## 2012-11-26 LAB — URINE CULTURE
Colony Count: NO GROWTH
Organism ID, Bacteria: NO GROWTH

## 2012-11-29 ENCOUNTER — Telehealth: Payer: Self-pay

## 2012-11-29 NOTE — Telephone Encounter (Signed)
Ok to take the diflucan.

## 2012-11-29 NOTE — Telephone Encounter (Signed)
Left detailed message.   

## 2012-11-29 NOTE — Telephone Encounter (Signed)
Alison Wilson is wanting to know if she should go ahead and take diflucan or wait until she finishes the doxycycline?

## 2012-12-02 ENCOUNTER — Ambulatory Visit: Payer: BC Managed Care – PPO | Admitting: Family Medicine

## 2012-12-06 ENCOUNTER — Encounter: Payer: Self-pay | Admitting: Family Medicine

## 2012-12-06 ENCOUNTER — Ambulatory Visit (INDEPENDENT_AMBULATORY_CARE_PROVIDER_SITE_OTHER): Payer: BC Managed Care – PPO | Admitting: Family Medicine

## 2012-12-06 VITALS — BP 100/61 | HR 71 | Wt 125.0 lb

## 2012-12-06 DIAGNOSIS — K219 Gastro-esophageal reflux disease without esophagitis: Secondary | ICD-10-CM

## 2012-12-06 MED ORDER — GI COCKTAIL ~~LOC~~: 30.0000 mL | Freq: Once | ORAL | Status: DC

## 2012-12-06 MED ORDER — ESOMEPRAZOLE MAGNESIUM 40 MG PO CPDR: 40.0000 mg | DELAYED_RELEASE_CAPSULE | Freq: Two times a day (BID) | ORAL | Status: DC

## 2012-12-06 MED ORDER — GI COCKTAIL ~~LOC~~
30.0000 mL | Freq: Once | ORAL | Status: AC
  Administered 2012-12-06: 30 mL via ORAL

## 2012-12-06 NOTE — Progress Notes (Signed)
  Subjective:    Patient ID: Alison Wilson, female    DOB: 12-25-1950, 62 y.o.   MRN: [id number]  HPI GERD - started saturday has only been eating soft food i.e. yogurt activia, toast, water she has taken OTC prilosec, alka seltzer, and she stated that her throat is on fire. Says even water is causing her sxs.  Says it really hurts.  She is getting scared. Her endo/colonoscopy is schedule for Friday ( 5 days).  She saw GI for the initial consult and I want her to take Prilosec. Says tries to stay upright for several hours after eating, to avoid exacerbating her symptoms.  Says this weekend could only tolerated toast.  Has been using salt water gargles to sooth her throat.  She is starting to feel weak.   No voimiting. Felt nauseated.  She says she has been to the emergency department because she felt so weak. No fever. No cold symptoms. No diarrhea.   Review of Systems     Objective:   Physical Exam  Constitutional: She is oriented to person, place, and time. She appears well-developed and well-nourished.  HENT:  Head: Normocephalic and atraumatic.  Right Ear: External ear normal.  Left Ear: External ear normal.  Nose: Nose normal.  Mouth/Throat: Oropharynx is clear and moist.  TMs and canals are clear.   Eyes: Conjunctivae and EOM are normal. Pupils are equal, round, and reactive to light.  Neck: Neck supple. No thyromegaly present.  Cardiovascular: Normal rate, regular rhythm and normal heart sounds.   Pulmonary/Chest: Effort normal and breath sounds normal. She has no wheezes.  Abdominal: Soft. Bowel sounds are normal. She exhibits no distension and no mass. There is tenderness. There is no rebound and no guarding.  Tender in the epigastric area.  Lymphadenopathy:    She has no cervical adenopathy.  Neurological: She is alert and oriented to person, place, and time.  Skin: Skin is warm and dry.  Psychiatric: She has a normal mood and affect.          Assessment & Plan:  GERD  -stop Prilosec. will start with NEXIUM BID.  Given a GI cocktaill today in the office if would provide acute relief.. Could consider carafate if not feeling better, but has stated she is before procedure on Friday. This certainly this would be an option for her. In the meantime she can use things like TUMS and Mylanta as needed to help soothe her stomach. I want her to continue to work on eating bland foods and getting good hydration. I strongly encouraged her to keep her appointment on Friday. I know she is very anxious about it is very worried. In fact today in the room she said she was worried about cancer. I explained her to important for [place] to figure out exactly what's going on with her and we have the endoscopy and colonoscopy results we will not know. She wants to know she can hold off on taking her pravastatin until her procedure and I think that that's absolutely fine.  Time spent 25 minutes, greater than 50% spent in counseling about her reflux and about her procedure on Friday.

## 2012-12-06 NOTE — Patient Instructions (Addendum)
Start Nexium twice a day.   Call if you feel like you are not improving.

## 2012-12-07 ENCOUNTER — Telehealth: Payer: Self-pay | Admitting: *Deleted

## 2012-12-07 MED ORDER — ESOMEPRAZOLE MAGNESIUM 40 MG PO CPDR: 40.0000 mg | DELAYED_RELEASE_CAPSULE | Freq: Two times a day (BID) | ORAL | Status: DC

## 2012-12-07 NOTE — Telephone Encounter (Signed)
Prescription sent

## 2012-12-07 NOTE — Telephone Encounter (Signed)
Pt calls to let you know the Nexium is working and would like a prescription sent to [facility]

## 2012-12-09 ENCOUNTER — Other Ambulatory Visit: Payer: Self-pay | Admitting: *Deleted

## 2012-12-09 MED ORDER — ESOMEPRAZOLE MAGNESIUM 40 MG PO CPDR: 40.0000 mg | DELAYED_RELEASE_CAPSULE | Freq: Two times a day (BID) | ORAL | Status: DC

## 2013-01-03 ENCOUNTER — Encounter: Payer: Self-pay | Admitting: Family Medicine

## 2013-01-07 ENCOUNTER — Encounter: Payer: Self-pay | Admitting: Family Medicine

## 2013-02-18 ENCOUNTER — Ambulatory Visit (INDEPENDENT_AMBULATORY_CARE_PROVIDER_SITE_OTHER): Payer: BC Managed Care – PPO | Admitting: Family Medicine

## 2013-02-18 ENCOUNTER — Encounter: Payer: Self-pay | Admitting: Family Medicine

## 2013-02-18 VITALS — BP 101/58 | HR 83 | Temp 98.7°F | Ht 65.6 in | Wt 127.0 lb

## 2013-02-18 DIAGNOSIS — J019 Acute sinusitis, unspecified: Secondary | ICD-10-CM

## 2013-02-18 DIAGNOSIS — K219 Gastro-esophageal reflux disease without esophagitis: Secondary | ICD-10-CM

## 2013-02-18 DIAGNOSIS — E785 Hyperlipidemia, unspecified: Secondary | ICD-10-CM

## 2013-02-18 MED ORDER — AZITHROMYCIN 250 MG PO TABS: ORAL_TABLET | ORAL | Status: DC

## 2013-02-18 NOTE — Patient Instructions (Signed)

## 2013-02-18 NOTE — Progress Notes (Signed)
  Subjective:    Patient ID: Alison Wilson, female    DOB: 02/26/1951, 62 y.o.   MRN: [id number]  HPI Cough x 9 days.  No fever this week but did initially. Some right ear pain. Clear drainage.  Bought sinex but didn't use it. Husband was ill first.  Sinus pressure on the left near her eye. She felt beter over the weekend and then got worse.  Mild ST from the coughing.  Using some cough drops. No prior history of allergies. She denies any itching in the ears, throat or eyes.  Back on pravachol for 1.5 weeks and odn well so far.    GERD- had ulcers on endoscopy.  She has really changed her diet and has been able to wean her nexium.  She is overall doing great the last 2 weeks.   Review of Systems     Objective:   Physical Exam  Constitutional: She is oriented to person, place, and time. She appears well-developed and well-nourished.  HENT:  Head: Normocephalic and atraumatic.  Right Ear: External ear normal.  Left Ear: External ear normal.  Nose: Nose normal.  Mouth/Throat: Oropharynx is clear and moist.  TMs and canals are clear. Turbinates are red with clear mucous  Eyes: Conjunctivae and EOM are normal. Pupils are equal, round, and reactive to light.  Neck: Neck supple. No thyromegaly present.  Cardiovascular: Normal rate, regular rhythm and normal heart sounds.   Pulmonary/Chest: Effort normal and breath sounds normal. She has no wheezes.  Lymphadenopathy:    She has no cervical adenopathy.  Neurological: She is alert and oriented to person, place, and time.  Skin: Skin is warm and dry.  Psychiatric: She has a normal mood and affect.          Assessment & Plan:  Sinusitis-  Will treat with azithromycin per patient preference. If she's not better in one week please call us back and make an appointment to be seen. Continue symptomatic care.  GERD- Much improved.  She can certainly restart the Nexium she starts to have any recurrence of symptoms. Continue to follow reflux dietary  measures.  Hyperlipidemia-she'll be due to recheck lipids in about 5 weeks, she will have been on it for total of 6 weeks at that point time. She can call the office for lab slip.

## 2013-02-20 ENCOUNTER — Other Ambulatory Visit: Payer: Self-pay | Admitting: Family Medicine

## 2013-03-08 ENCOUNTER — Other Ambulatory Visit: Payer: Self-pay | Admitting: Family Medicine

## 2013-03-08 ENCOUNTER — Telehealth: Payer: Self-pay | Admitting: Family Medicine

## 2013-03-08 DIAGNOSIS — R935 Abnormal findings on diagnostic imaging of other abdominal regions, including retroperitoneum: Secondary | ICD-10-CM

## 2013-03-08 NOTE — Telephone Encounter (Signed)
Call pt: due for repeat MRI of liver.  I ordered test so they should be contacting her soon.

## 2013-03-08 NOTE — Progress Notes (Signed)
Ordered MRI liver for f/u

## 2013-03-09 ENCOUNTER — Telehealth: Payer: Self-pay | Admitting: *Deleted

## 2013-03-09 DIAGNOSIS — D1800 Hemangioma unspecified site: Secondary | ICD-10-CM

## 2013-03-09 NOTE — Telephone Encounter (Signed)
Pt calls back and states she is not gonna have the followup MRI of liver because everything is fine and she can not afford to have another one done. Just FYI

## 2013-03-09 NOTE — Telephone Encounter (Signed)
Pt notified and instructed via VM to get labs drawn before MRI can be scheduled as she is over age of 88. Order faxed to lab. Barry Dienes, LPN

## 2013-08-10 ENCOUNTER — Telehealth: Payer: Self-pay

## 2013-08-10 ENCOUNTER — Ambulatory Visit (INDEPENDENT_AMBULATORY_CARE_PROVIDER_SITE_OTHER): Payer: BC Managed Care – PPO | Admitting: Physician Assistant

## 2013-08-10 DIAGNOSIS — Z23 Encounter for immunization: Secondary | ICD-10-CM

## 2013-08-10 DIAGNOSIS — E785 Hyperlipidemia, unspecified: Secondary | ICD-10-CM

## 2013-08-10 NOTE — Telephone Encounter (Signed)
Patient was in office for flu shot she stated that she wanted to have some lab work done on next week and wanted to know if you could go ahead and put the order in. She did state cholesterol check. Arna Luis,CMA

## 2013-08-10 NOTE — Progress Notes (Signed)
  Subjective:    Patient ID: Alison Wilson, female    DOB: March 29, 1951, 62 y.o.   MRN: [id number]  HPI    Review of Systems     Objective:   Physical Exam        Assessment & Plan:  Flu shot given in office without complications. Tandy Gaw PA-C

## 2013-08-10 NOTE — Telephone Encounter (Signed)
I guess just a CMP and fasting lipid panel. Okay to use hyperlipidemia, 272.4

## 2013-08-11 ENCOUNTER — Other Ambulatory Visit: Payer: Self-pay | Admitting: Family Medicine

## 2013-08-25 LAB — COMPREHENSIVE METABOLIC PANEL
AST: 15 U/L (ref 0–37)
Albumin: 4.1 g/dL (ref 3.5–5.2)
Alkaline Phosphatase: 62 U/L (ref 39–117)
BUN: 11 mg/dL (ref 6–23)
Glucose, Bld: 117 mg/dL — ABNORMAL HIGH (ref 70–99)
Potassium: 4.5 mEq/L (ref 3.5–5.3)
Sodium: 141 mEq/L (ref 135–145)
Total Bilirubin: 0.5 mg/dL (ref 0.3–1.2)
Total Protein: 6.6 g/dL (ref 6.0–8.3)

## 2013-08-25 LAB — LIPID PANEL
HDL: 55 mg/dL (ref >39)
LDL Cholesterol: 132 mg/dL — ABNORMAL HIGH (ref 0–99)
Triglycerides: 84 mg/dL (ref <150)

## 2014-01-22 ENCOUNTER — Other Ambulatory Visit: Payer: Self-pay | Admitting: Family Medicine

## 2014-01-26 ENCOUNTER — Other Ambulatory Visit: Payer: Self-pay | Admitting: Family Medicine

## 2014-02-01 ENCOUNTER — Telehealth: Payer: Self-pay | Admitting: *Deleted

## 2014-02-01 DIAGNOSIS — IMO0001 Reserved for inherently not codable concepts without codable children: Secondary | ICD-10-CM

## 2014-02-01 DIAGNOSIS — T887XXA Unspecified adverse effect of drug or medicament, initial encounter: Secondary | ICD-10-CM

## 2014-02-01 NOTE — Telephone Encounter (Signed)
Recommend check a CK on the Pravachol just to make sure there is no sign of excess muscle breakdown. Then can stop the medication for couple weeks and see if she feels better. If she definitely feels better off of it and we can try a different statin in its place and see if she tolerates it better. Order printed.

## 2014-02-01 NOTE — Telephone Encounter (Signed)
Pt on Pravachol and getting a lot of joint and muscle aches.  Wants to know if she can stop this due to these symptoms.  Due for labs within a month she thinks.  Please advise  She also states that she is starting to have acid reflux again the last few days and wonders if Pravachol is doing this as well.

## 2014-02-01 NOTE — Telephone Encounter (Signed)
Pt notified of MD instructions via VM at pt request.  Instructed can go to lab at anytime to have CK drawn. Clemetine Marker, LPN

## 2014-02-02 LAB — CK: Total CK: 69 U/L (ref 7–177)

## 2014-03-01 ENCOUNTER — Other Ambulatory Visit: Payer: Self-pay | Admitting: Family Medicine

## 2014-03-08 ENCOUNTER — Encounter: Payer: Self-pay | Admitting: Physician Assistant

## 2014-03-08 ENCOUNTER — Ambulatory Visit (INDEPENDENT_AMBULATORY_CARE_PROVIDER_SITE_OTHER): Payer: BC Managed Care – PPO | Admitting: Physician Assistant

## 2014-03-08 VITALS — BP 116/64 | HR 83 | Ht 65.6 in | Wt 129.0 lb

## 2014-03-08 DIAGNOSIS — L299 Pruritus, unspecified: Secondary | ICD-10-CM

## 2014-03-08 DIAGNOSIS — R21 Rash and other nonspecific skin eruption: Secondary | ICD-10-CM

## 2014-03-08 MED ORDER — TRIAMCINOLONE ACETONIDE 0.1 % EX CREA: 1.0000 "application " | TOPICAL_CREAM | Freq: Two times a day (BID) | CUTANEOUS | Status: DC

## 2014-03-08 MED ORDER — HYDROXYZINE HCL 25 MG PO TABS: 25.0000 mg | ORAL_TABLET | Freq: Three times a day (TID) | ORAL | Status: DC | PRN

## 2014-03-08 NOTE — Patient Instructions (Addendum)
Start vistaril for itching up to three times a day.  Zyrtec/Claritin daily while rash and itching are present.  triamincolone cream as needed twice a day.

## 2014-03-08 NOTE — Progress Notes (Signed)
   Subjective:    Patient ID: Alison Wilson, female    DOB: April 10, 1951, 63 y.o.   MRN: [id number]  HPI Pt is a 63 yo female who presents to the clinic with itchy rash that comes and goes all over her body for the last week and a half. To the best of her memory rash started may 16th. She first notice the rash on her arms as tiny red papules. She assumed it was poison ivy because she had been house sitting for friends and out in their yard and garden watering plants. She denies any new medications, using same detergent. The rash has now resolved on her arms but popped up on her left upper abdomen. She also has a few spots on her chest. Her scalp line is also itchy. Denies any pain, n/v/d, fever, chills. Tried OTC lotion for poison ivy with no relief. No problems with swallowing or breathing.  I     Review of Systems  All other systems reviewed and are negative.      Objective:   Physical Exam  Constitutional: She is oriented to person, place, and time. She appears well-developed and well-nourished.  HENT:  Head: Normocephalic and atraumatic.  Mouth/Throat: Oropharynx is clear and moist. No oropharyngeal exudate.  No rash or mites seen over scalp.   Cardiovascular: Normal rate, regular rhythm and normal heart sounds.   Pulmonary/Chest: Effort normal and breath sounds normal. She has no wheezes.    Abdominal:    Neurological: She is alert and oriented to person, place, and time.  Psychiatric: She has a normal mood and affect. Her behavior is normal.          Assessment & Plan:  Rash- unlcear etiology. Encouraged pt to consider any new contacts with lotions, clothing etc. Will check CBC and liver enymes. She has been on pravachol for years do not think they are correlated with. Suspect some type of contact dermatitis. Offered solumedrol shot but pt declined today. Gave vistaril for itching and triamcinolone for affected areas. Encouraged starting zyrtec daily. Call if worsening or not  improving. Can also come back and get shot.

## 2014-03-09 LAB — CBC WITH DIFFERENTIAL/PLATELET
BASOS ABS: 0.1 10*3/uL (ref 0.0–0.1)
Basophils Relative: 1 % (ref 0–1)
Eosinophils Absolute: 0.2 10*3/uL (ref 0.0–0.7)
Eosinophils Relative: 2 % (ref 0–5)
HEMATOCRIT: 47.9 % — AB (ref 36.0–46.0)
Hemoglobin: 16.2 g/dL — ABNORMAL HIGH (ref 12.0–15.0)
LYMPHS PCT: 31 % (ref 12–46)
Lymphs Abs: 3.1 10*3/uL (ref 0.7–4.0)
MCH: 30.2 pg (ref 26.0–34.0)
MCHC: 33.8 g/dL (ref 30.0–36.0)
MCV: 89.4 fL (ref 78.0–100.0)
Monocytes Absolute: 1.1 10*3/uL — ABNORMAL HIGH (ref 0.1–1.0)
Monocytes Relative: 11 % (ref 3–12)
NEUTROS ABS: 5.5 10*3/uL (ref 1.7–7.7)
Neutrophils Relative %: 55 % (ref 43–77)
PLATELETS: 241 10*3/uL (ref 150–400)
RBC: 5.36 MIL/uL — ABNORMAL HIGH (ref 3.87–5.11)
RDW: 13.1 % (ref 11.5–15.5)
WBC: 10 10*3/uL (ref 4.0–10.5)

## 2014-03-09 LAB — COMPLETE METABOLIC PANEL WITH GFR
ALBUMIN: 4.6 g/dL (ref 3.5–5.2)
ALK PHOS: 60 U/L (ref 39–117)
ALT: 12 U/L (ref 0–35)
AST: 20 U/L (ref 0–37)
BUN: 12 mg/dL (ref 6–23)
CO2: 24 mEq/L (ref 19–32)
Calcium: 10.4 mg/dL (ref 8.4–10.5)
Chloride: 103 mEq/L (ref 96–112)
Creat: 0.85 mg/dL (ref 0.50–1.10)
GFR, Est African American: 84 mL/min
GFR, Est Non African American: 73 mL/min
GLUCOSE: 102 mg/dL — AB (ref 70–99)
POTASSIUM: 4.1 meq/L (ref 3.5–5.3)
Sodium: 137 mEq/L (ref 135–145)
Total Bilirubin: 0.7 mg/dL (ref 0.2–1.2)
Total Protein: 6.8 g/dL (ref 6.0–8.3)

## 2014-03-24 NOTE — Telephone Encounter (Signed)
Error

## 2014-05-14 IMAGING — CR DG CHEST 2V
2 series · 2 of 2 positions shown · non-contrast
Comparison: None.

CLINICAL DATA: Cough,  long time smoking history, congestion

CHEST - 2 VIEW

[view not recorded (1 of 2)]
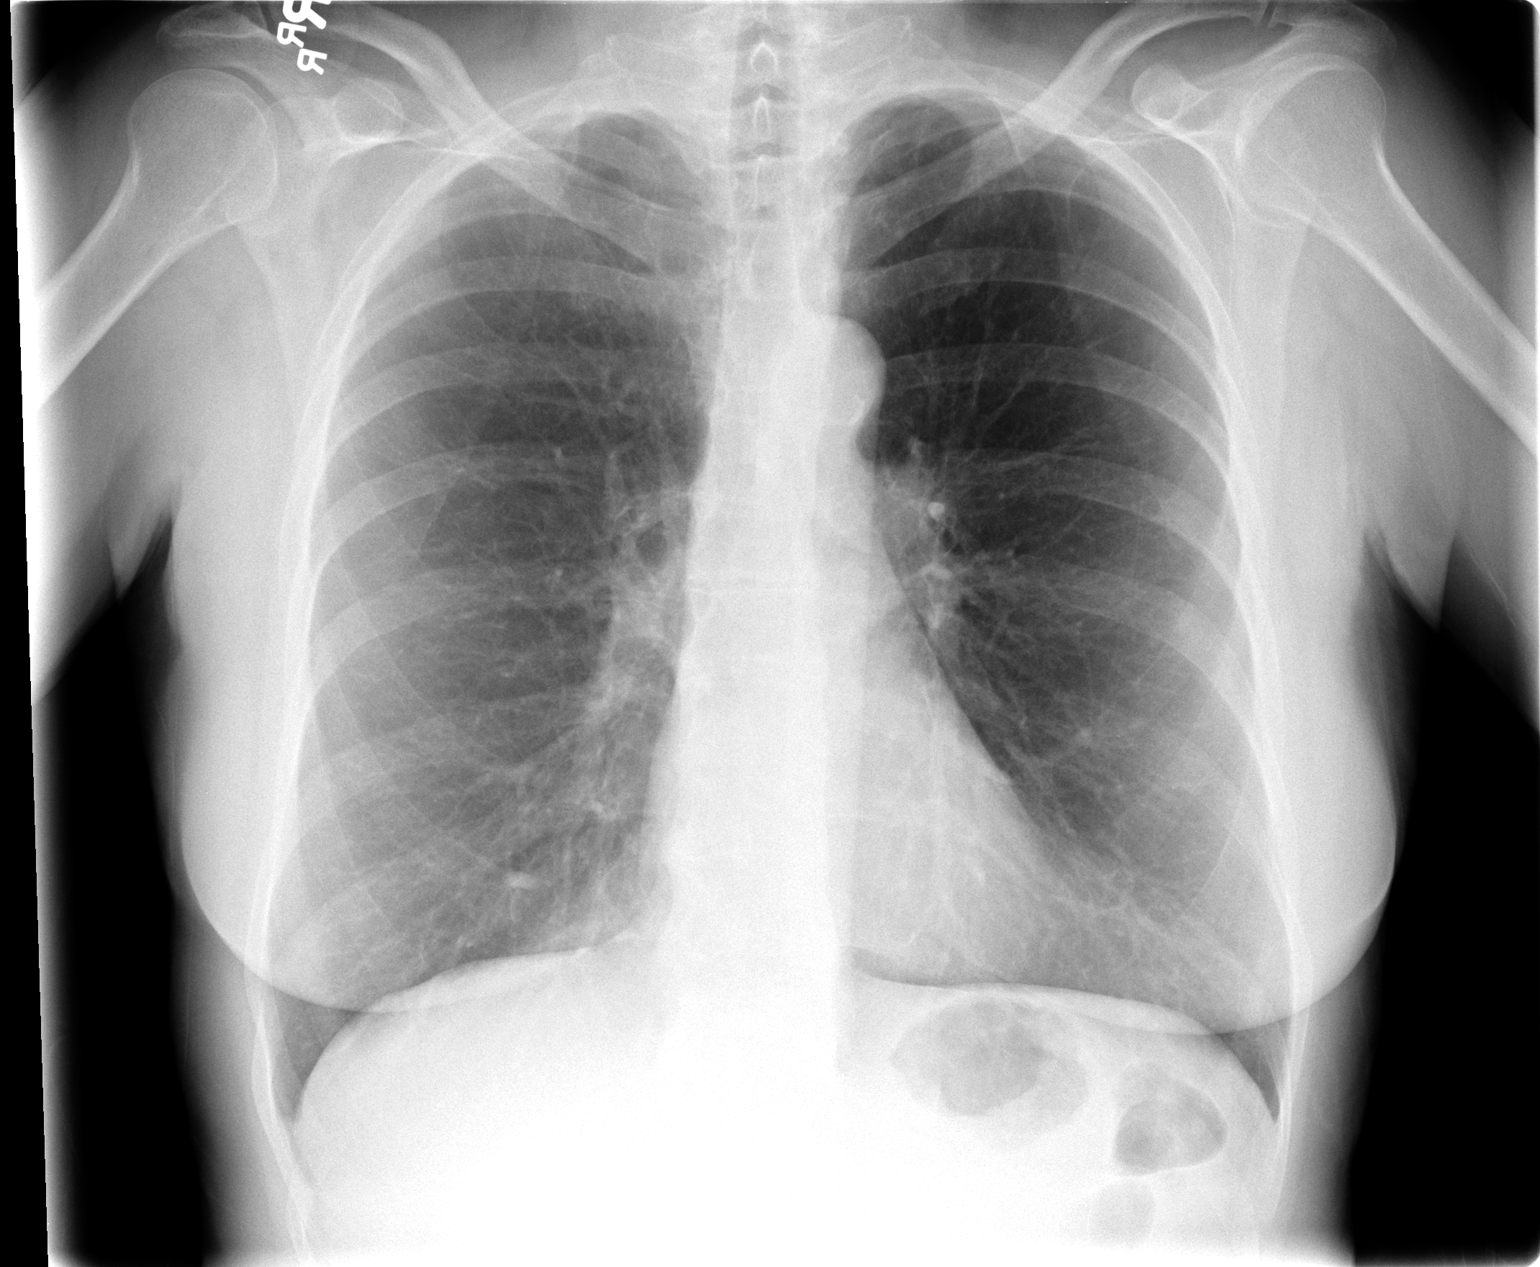

[view not recorded (2 of 2)]
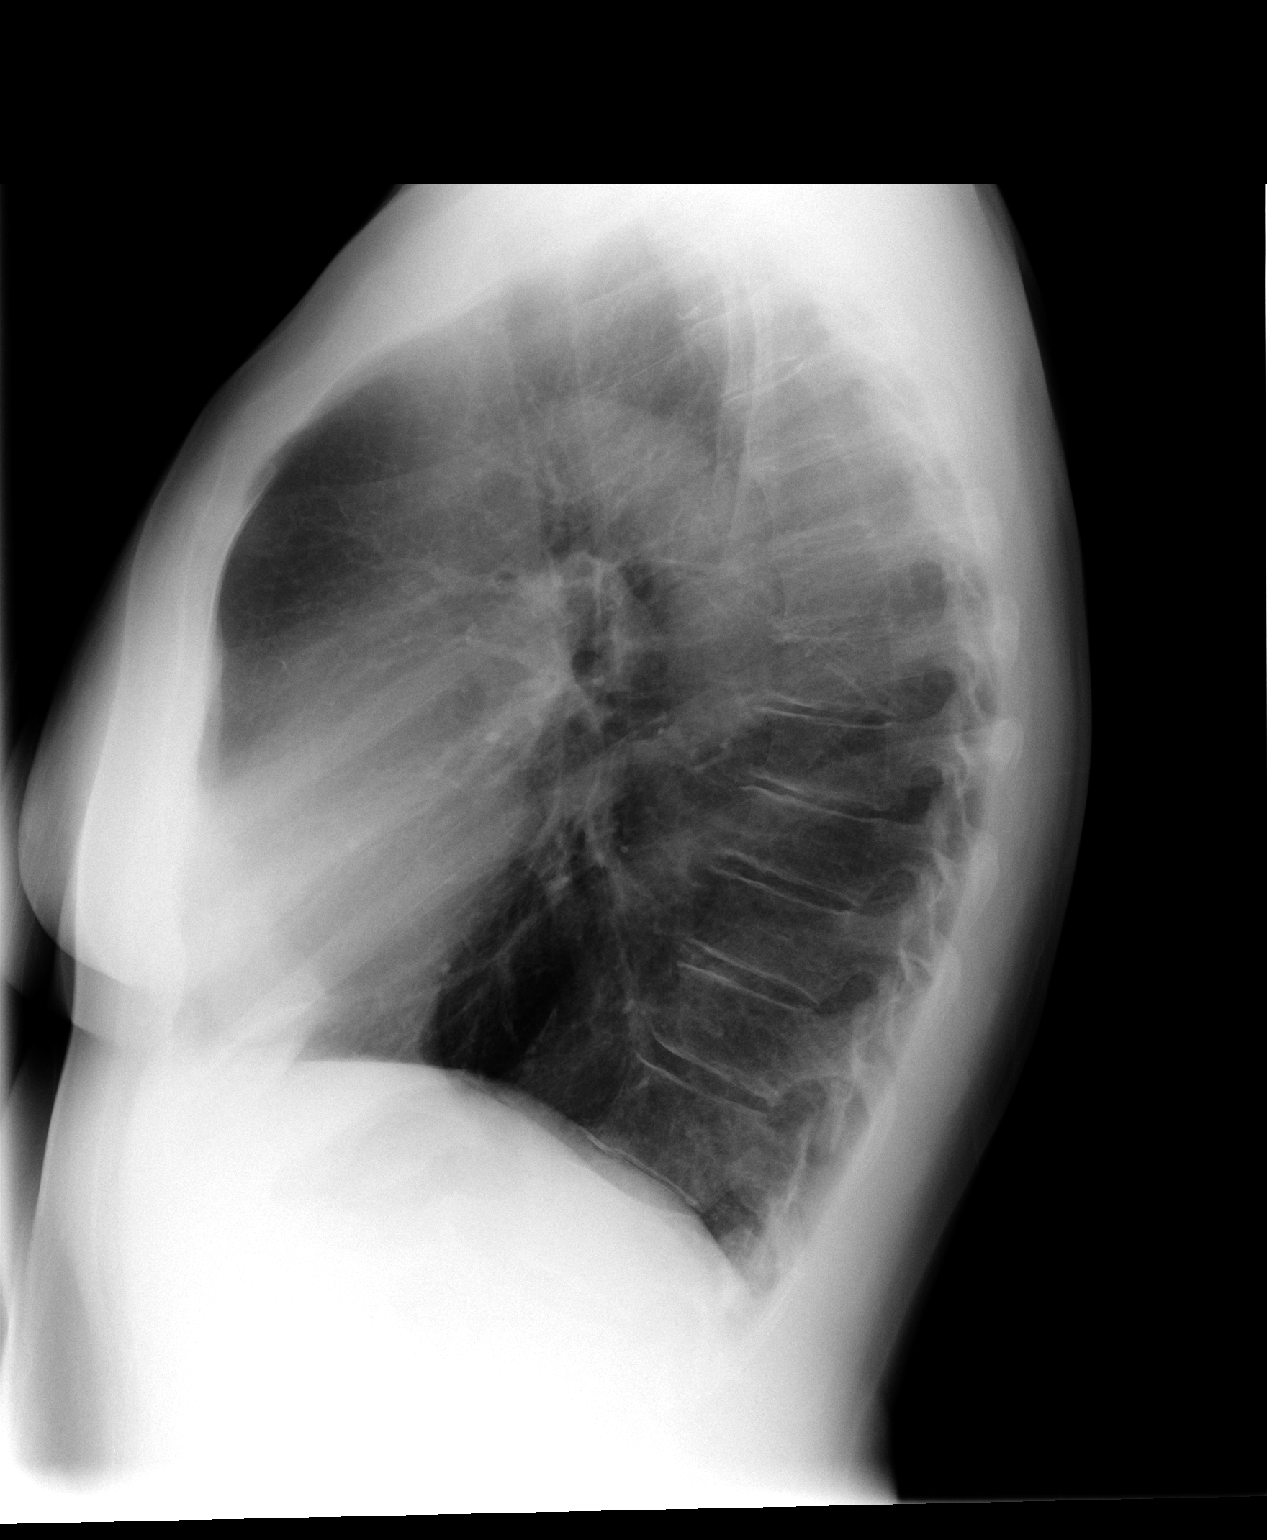

[2 of 2 positions shown; findings below may reference images not displayed]

FINDINGS: The lungs are clear but hyperaerated consistent with
emphysema.  Mediastinal contours appear normal.  The heart is
within normal limits in size.  No bony abnormality is seen.
IMPRESSION: Emphysema.  No active lung disease.

## 2014-07-26 ENCOUNTER — Ambulatory Visit (INDEPENDENT_AMBULATORY_CARE_PROVIDER_SITE_OTHER): Payer: BC Managed Care – PPO | Admitting: Family Medicine

## 2014-07-26 VITALS — Temp 97.6°F

## 2014-07-26 DIAGNOSIS — Z23 Encounter for immunization: Secondary | ICD-10-CM

## 2014-07-26 NOTE — Progress Notes (Signed)
   Subjective:    Patient ID: Alison Wilson, female    DOB: [birth year], 63 y.o.   MRN: [id number]  HPI Alison Wilson presents to office for flu shot injection which she rec'd without complication. She was counseled on side effects and denies fever or recent sickness. Margette Fast, CMA    Review of Systems     Objective:   Physical Exam        Assessment & Plan:

## 2015-02-21 ENCOUNTER — Telehealth: Payer: Self-pay | Admitting: *Deleted

## 2015-02-21 NOTE — Telephone Encounter (Signed)
Just getting to it today. Please let her know that I was out of town so at this been trying to catch up with all the paperwork on my desk. I cannot write her out of jury duty for a hiatal hernia. I do understand that it can be problematic and cause pain but they do not require that she sit for extended periods of time. They will allow frequent breaks and for her to move around.

## 2015-02-21 NOTE — Telephone Encounter (Signed)
Spoke with pt to notify her and she said that she's done it before 2 years ago and they in fact do not give frequent breaks and that she had a really hard time getting through it.

## 2015-02-21 NOTE — Telephone Encounter (Signed)
Pt left a vm asking about a letter (note?) that she dropped off Monday for you, asking for an excuse for jury duty due to her hiatal hernia.  Do you know anything about this? Please advise.

## 2015-02-27 NOTE — Telephone Encounter (Signed)
A hiatal hernia is not a reason to be excused from jury duty.

## 2015-07-11 ENCOUNTER — Ambulatory Visit (INDEPENDENT_AMBULATORY_CARE_PROVIDER_SITE_OTHER): Payer: BLUE CROSS/BLUE SHIELD | Admitting: Family Medicine

## 2015-07-11 VITALS — BP 145/88 | HR 74 | Temp 98.8°F | Wt 133.0 lb

## 2015-07-11 DIAGNOSIS — Z23 Encounter for immunization: Secondary | ICD-10-CM

## 2015-07-11 NOTE — Progress Notes (Signed)
   Subjective:    Patient ID: Alison Wilson, female    DOB: July 22, 1951, 64 y.o.   MRN: [id number]  HPI  Patient is here for flu vaccine.  Denies any fever, chills, and egg allergies.  Review of Systems     Objective:   Physical Exam        Assessment & Plan:   Patient tolerated injection well without complications.

## 2016-08-28 ENCOUNTER — Ambulatory Visit (INDEPENDENT_AMBULATORY_CARE_PROVIDER_SITE_OTHER): Payer: BLUE CROSS/BLUE SHIELD | Admitting: Family Medicine

## 2016-08-28 DIAGNOSIS — Z23 Encounter for immunization: Secondary | ICD-10-CM | POA: Diagnosis not present

## 2016-09-18 ENCOUNTER — Ambulatory Visit (INDEPENDENT_AMBULATORY_CARE_PROVIDER_SITE_OTHER): Payer: BLUE CROSS/BLUE SHIELD | Admitting: Family Medicine

## 2016-09-18 ENCOUNTER — Encounter: Payer: Self-pay | Admitting: Family Medicine

## 2016-09-18 VITALS — BP 139/68 | HR 77 | Ht 66.0 in | Wt 133.0 lb

## 2016-09-18 DIAGNOSIS — J0100 Acute maxillary sinusitis, unspecified: Secondary | ICD-10-CM | POA: Diagnosis not present

## 2016-09-18 MED ORDER — AZITHROMYCIN 250 MG PO TABS: ORAL_TABLET | ORAL | 0 refills | Status: DC

## 2016-09-18 NOTE — Progress Notes (Signed)
   Subjective:    Patient ID: Alison Wilson, female    DOB: 12-25-1950, 65 y.o.   MRN: [id number]  HPI 65 year old female comes in today with sinus congestion and symptoms for approximately 10 days. She has had some facial pressure and sweats and chills and a runny nose but no fever. The mucus is mostly white but is very thick. She's been taking an over-the-counter.  She bought some Robitussin-DM but she hasn't used it yet.   Review of Systems     Objective:   Physical Exam  Constitutional: She is oriented to person, place, and time. She appears well-developed and well-nourished.  HENT:  Head: Normocephalic and atraumatic.  Right Ear: External ear normal.  Left Ear: External ear normal.  Nose: Nose normal.  Mouth/Throat: Oropharynx is clear and moist.  TMs and canals are clear.   Eyes: Conjunctivae and EOM are normal. Pupils are equal, round, and reactive to light.  Neck: Neck supple. No thyromegaly present.  Cardiovascular: Normal rate, regular rhythm and normal heart sounds.   Pulmonary/Chest: Effort normal and breath sounds normal. She has no wheezes.  Lymphadenopathy:    She has no cervical adenopathy.  Neurological: She is alert and oriented to person, place, and time.  Skin: Skin is warm and dry.  Psychiatric: She has a normal mood and affect.        Assessment & Plan:  Acute sinusitis-we'll treat with azithromycin per patient preference. Okay to start over-the-counter cough and cold medication. Not better in one week please call office

## 2016-09-18 NOTE — Patient Instructions (Addendum)

## 2016-09-25 ENCOUNTER — Encounter: Payer: BLUE CROSS/BLUE SHIELD | Admitting: Family Medicine

## 2016-10-21 ENCOUNTER — Ambulatory Visit: Payer: BLUE CROSS/BLUE SHIELD | Admitting: Family Medicine

## 2017-04-30 ENCOUNTER — Encounter: Payer: Self-pay | Admitting: Osteopathic Medicine

## 2017-04-30 ENCOUNTER — Ambulatory Visit (INDEPENDENT_AMBULATORY_CARE_PROVIDER_SITE_OTHER): Payer: BLUE CROSS/BLUE SHIELD | Admitting: Osteopathic Medicine

## 2017-04-30 VITALS — BP 134/78 | HR 78 | Wt 135.0 lb

## 2017-04-30 DIAGNOSIS — K5900 Constipation, unspecified: Secondary | ICD-10-CM | POA: Diagnosis not present

## 2017-04-30 DIAGNOSIS — R350 Frequency of micturition: Secondary | ICD-10-CM | POA: Diagnosis not present

## 2017-04-30 DIAGNOSIS — R319 Hematuria, unspecified: Secondary | ICD-10-CM

## 2017-04-30 LAB — POCT URINALYSIS DIPSTICK
Bilirubin, UA: NEGATIVE
Glucose, UA: NEGATIVE
KETONES UA: 15
LEUKOCYTES UA: NEGATIVE
Nitrite, UA: NEGATIVE
PH UA: 5.5 (ref 5.0–8.0)
UROBILINOGEN UA: 0.2 U/dL

## 2017-04-30 NOTE — Progress Notes (Signed)
HPI: Ramata Strothman is a 66 y.o. female  who presents to [facility] today, 04/30/17,  for chief complaint of:  Chief Complaint  Patient presents with  . Constipation    Constipation - ongoing for about 4 days, eating more junk food than usual due to vacation/weddings/fmaily reunion past few week, gas w/ bloating/cramping, lower abdominal discomfort. Was concerned about abdominal pain so she didn't take any meds since the MiraLax container noted to consult a physician if any pain symptoms. Last BM a few hours ago, softer. Has been having BM daily but smaller and harder than usual, no bloody stool.   Concern for UTI - frequency lately, no dysuria.     Past medical history, surgical history, social history and family history reviewed.  Patient Active Problem List   Diagnosis Date Noted  . Hyperlipidemia 07/07/2011  . Tobacco abuse 06/19/2011  . Arthritis 06/19/2011    Current medication list and allergy/intolerance information reviewed.   No current outpatient prescriptions on file prior to visit.   No current facility-administered medications on file prior to visit.    Allergies  Allergen Reactions  . Dexilant [Dexlansoprazole] Other (See Comments)    Pt says felt more SOB on med but was also being tx'd for bronchitis at the time.   . Cephalexin Rash      Review of Systems:  Constitutional: No recent illness, no fever/chills  HEENT: No  headache, no vision change  Cardiac: No  chest pain, No  pressure, No palpitations  Respiratory:  No  shortness of breath. No  Cough  Gastrointestinal: No  abdominal pain, +change on bowel habits, no bloody stool, no nausea/vomiting,   Musculoskeletal: No new myalgia/arthralgia  Skin: No  Rash  Neurologic: No  weakness, No  Dizziness  Exam:  BP 134/78   Pulse 78   Wt 135 lb (61.2 kg)   SpO2 98%   BMI 21.79 kg/m   Constitutional: VS see above. General Appearance: alert, well-developed,  well-nourished, NAD  Eyes: Normal lids and conjunctive, non-icteric sclera  Ears, Nose, Mouth, Throat: MMM, Normal external inspection ears/nares/mouth/lips/gums.  Neck: No masses, trachea midline.   Respiratory: Normal respiratory effort. no wheeze, no rhonchi, no rales  Cardiovascular: S1/S2 normal, no murmur, no rub/gallop auscultated. RRR.   Musculoskeletal: Gait normal. Symmetric and independent movement of all extremities  Abdominal: normal BS x4, mild TTP LLQ but no guarding/rebound, no other tenderness, nondistended  Neurological: Normal balance/coordination. No tremor.  Skin: warm, dry, intact.   Psychiatric: Normal judgment/insight. Normal mood and affect. Oriented x3.    Recent Results (from the past 2160 hour(s))  POCT urinalysis dipstick     Status: Abnormal   Collection Time: 04/30/17 11:59 AM  Result Value Ref Range   Color, UA yellow    Clarity, UA clear    Glucose, UA neg    Bilirubin, UA neg    Ketones, UA 15    Spec Grav, UA >=1.030 (A) 1.010 - 1.025   Blood, UA large    pH, UA 5.5 5.0 - 8.0   Protein, UA trace    Urobilinogen, UA 0.2 0.2 or 1.0 E.U./dL   Nitrite, UA neg    Leukocytes, UA Negative Negative     ASSESSMENT/PLAN:   Long discussion with patient - she is quite anxious about these symptoms but I see no clinical indication of severe infection or obstruction  Conservative management for now, advised OK to use stool softeners, Miralax, increase fiber intake, increase hydration  and activity levels. Medications may increase bloating in the short term, will focus on long-term prevention of constipation and RTC precautions reviewed re: alarm symptoms   F/u w/ PCP, she has not been here in some time and has additional questions about colon cancer screening.   Constipation, unspecified constipation type  Urinary frequency - Plan: POCT urinalysis dipstick  Hematuria, unspecified type - Plan: Urine Culture, Urinalysis, microscopic only     Patient Instructions  Patient education: Constipation in adults  CONSTIPATION OVERVIEW - Constipation refers to a change in bowel habits, but it has varied meanings. Stools may be too hard or too small, difficult to pass, or infrequent (less than three times per week). People with constipation may also notice a frequent need to strain and a sense that the bowels are not empty. Each year more than 2.5 million Americans visit their healthcare provider for relief from this problem. Many factors can contribute to or cause constipation, although in most people, no single cause can be found. In general, constipation occurs more frequently as you get older.   CONSTIPATION DIAGNOSIS - Constipation can usually be diagnosed based upon your symptoms and a physical examination. You should also mention any medications you take regularly since some medications can cause constipation.  When to seek help - Most people can treat constipation at home, without seeing a healthcare provider. However, you should speak with a healthcare provider if the problem: ?Is new (ie, represents a change in your normal pattern) ?Lasts longer than three weeks ?Is severe ?Is associated with any other concerning features such as blood on the toilet paper, weight loss, fevers, or weakness   CONSTIPATION TREATMENT - Treatment for constipation includes changing some behaviors, eating foods high in fiber, and using laxatives or enemas if needed.  You can try these treatments at home, before seeing a healthcare provider. However, if you do not have a bowel movement within a few days, you should call your healthcare provider for further assistance.   Behavior changes - The bowels are most active following meals, and this is often the time when stools will pass most readily. If you ignore your body's signals to have a bowel movement, the signals become weaker and weaker over time. By paying close attention to these signals, you may have  an easier time moving your bowels. Drinking a caffeine-containing beverage in the morning may also be helpful.  Increase fiber - Increasing fiber in your diet may reduce or eliminate constipation. The recommended amount of dietary fiber is 20 to 35 grams of fiber per day. By reading the product information panel on the side of the package, you can determine the number of grams of fiber per serving Many fruits and vegetables can be particularly helpful in preventing and treating constipation. This is especially true of citrus fruits, prunes, and prune juice. Some breakfast cereals are also an excellent source of dietary fiber. Fiber side effects - Consuming large amounts of fiber can cause abdominal bloating or gas; this can be minimized by starting with a small amount and slowly increasing until stools become softer and more frequent.   LAXATIVES - If behavior changes and increasing fiber does not relieve your constipation, you may try taking a laxative. A variety of laxatives are available for treating constipation. The choice between them is based upon how they work, how safe the treatment is, and your healthcare provider's preferences. In general, laxatives can be categorized into the following groups:  Bulk forming laxatives - These include  natural fiber and commercial fiber preparations such as: ?Psyllium (Konsyl; Metamucil; Perdiem) ?Methylcellulose (Citrucel) ?Calcium polycarbophil (FiberCon; Fiber-Lax; Mitrolan) ?Wheat dextrin (Benefiber) You should increase the dose of fiber supplements slowly to prevent gas and cramping, and you should always take the supplement with plenty of fluid.  Hyperosmolar laxatives - Hyperosmolar laxatives include: ?Polyethylene glycol (MiraLax, Glycolax) ?Lactulose ?Sorbitol Polyethylene glycol is generally preferred since it does not cause gas or bloating and is available in the [place] without a prescription. Lactulose and sorbitol can produce gas and  bloating. Sorbitol works as well as lactulose and is much less expensive.  Saline laxatives - Saline laxatives such as magnesium hydroxide (Milk of Magnesia) and magnesium citrate (Evac-Q-Mag) act similarly to the hyperosmolar laxatives.  Stimulant laxatives - Stimulant laxatives include senna (eg, Black Draught, Ex-lax, Fletcher's, Castoria, Senokot) and bisacodyl (eg, Correctol, Doxidan, Dulcolax). Some people overuse stimulant laxatives. Taking stimulant laxatives regularly or in large amounts can cause side effects, including low potassium levels. Thus, you should take these drugs carefully if you must use them regularly. However, there is no convincing evidence that using stimulant laxatives regularly damages the colon, and they do not increase the risk for colorectal cancer or other tumors.  Pills, suppositories, or enemas? - Laxatives are available as pills that you take by mouth or as suppositories or enemas that you insert into the rectum. In general, suppositories and enemas work more quickly compared to pills, but many people do not like using them.  Healthcare providers occasionally recommend prepackaged enema kits containing sodium phosphate/biphosphate (Fleet) if you have not responded to other treatments. These are not recommended if you have problems with your heart or kidneys, and should not be used more than once unless directed by your healthcare provider.  Constipation treatments to avoid ?Emollients - Emollient laxatives, principally mineral oil, soften stools by moisturizing them. However, other treatments have fewer risks and equal benefit. ?Natural products - A wide variety of natural products are advertised for constipation. Some of them contain the active ingredients found in commercially available laxatives. However, their dose and purity may not be carefully controlled. Thus, these products are not generally recommended.  A variety of home-made enema preparations have been  used throughout the years, such as soapsuds, hydrogen peroxide, and household detergents. These can be extremely irritating to the lining of the intestine and should be avoided.   BIOFEEDBACK FOR CONSTIPATION - Biofeedback is a behavioral approach that may help some people with severe chronic constipation who involuntarily squeeze (rather than relax) their muscles while having a bowel movement.       Follow-up plan: Return in about 1 week (around 05/07/2017) for recheck constipation with PCP and discuss colon cancer screening.  Visit summary with medication list and pertinent instructions was printed for patient to review, alert [place] if any changes needed. All questions at time of visit were answered - patient instructed to contact office with any additional concerns. ER/RTC precautions were reviewed with the patient and understanding verbalized.   Note: Total time spent 25 minutes, greater than 50% of the visit was spent face-to-face counseling and coordinating care for the following: The primary encounter diagnosis was Constipation, unspecified constipation type. Diagnoses of Urinary frequency and Hematuria, unspecified type were also pertinent to this visit.Marland Kitchen

## 2017-04-30 NOTE — Patient Instructions (Addendum)
Patient education: Constipation in adults  CONSTIPATION OVERVIEW - Constipation refers to a change in bowel habits, but it has varied meanings. Stools may be too hard or too small, difficult to pass, or infrequent (less than three times per week). People with constipation may also notice a frequent need to strain and a sense that the bowels are not empty. Each year more than 2.5 million Americans visit their healthcare provider for relief from this problem. Many factors can contribute to or cause constipation, although in most people, no single cause can be found. In general, constipation occurs more frequently as you get older.   CONSTIPATION DIAGNOSIS - Constipation can usually be diagnosed based upon your symptoms and a physical examination. You should also mention any medications you take regularly since some medications can cause constipation.  When to seek help - Most people can treat constipation at home, without seeing a healthcare provider. However, you should speak with a healthcare provider if the problem: ?Is new (ie, represents a change in your normal pattern) ?Lasts longer than three weeks ?Is severe ?Is associated with any other concerning features such as blood on the toilet paper, weight loss, fevers, or weakness   CONSTIPATION TREATMENT - Treatment for constipation includes changing some behaviors, eating foods high in fiber, and using laxatives or enemas if needed.  You can try these treatments at home, before seeing a healthcare provider. However, if you do not have a bowel movement within a few days, you should call your healthcare provider for further assistance.   Behavior changes - The bowels are most active following meals, and this is often the time when stools will pass most readily. If you ignore your body's signals to have a bowel movement, the signals become weaker and weaker over time. By paying close attention to these signals, you may have an easier time moving your  bowels. Drinking a caffeine-containing beverage in the morning may also be helpful.  Increase fiber - Increasing fiber in your diet may reduce or eliminate constipation. The recommended amount of dietary fiber is 20 to 35 grams of fiber per day. By reading the product information panel on the side of the package, you can determine the number of grams of fiber per serving Many fruits and vegetables can be particularly helpful in preventing and treating constipation. This is especially true of citrus fruits, prunes, and prune juice. Some breakfast cereals are also an excellent source of dietary fiber. Fiber side effects - Consuming large amounts of fiber can cause abdominal bloating or gas; this can be minimized by starting with a small amount and slowly increasing until stools become softer and more frequent.   LAXATIVES - If behavior changes and increasing fiber does not relieve your constipation, you may try taking a laxative. A variety of laxatives are available for treating constipation. The choice between them is based upon how they work, how safe the treatment is, and your healthcare provider's preferences. In general, laxatives can be categorized into the following groups:  Bulk forming laxatives - These include natural fiber and commercial fiber preparations such as: ?Psyllium (Konsyl; Metamucil; Perdiem) ?Methylcellulose (Citrucel) ?Calcium polycarbophil (FiberCon; Fiber-Lax; Mitrolan) ?Wheat dextrin (Benefiber) You should increase the dose of fiber supplements slowly to prevent gas and cramping, and you should always take the supplement with plenty of fluid.  Hyperosmolar laxatives - Hyperosmolar laxatives include: ?Polyethylene glycol (MiraLax, Glycolax) ?Lactulose ?Sorbitol Polyethylene glycol is generally preferred since it does not cause gas or bloating and is available in the  [place] without a prescription. Lactulose and sorbitol can produce gas and bloating. Sorbitol works as  well as lactulose and is much less expensive.  Saline laxatives - Saline laxatives such as magnesium hydroxide (Milk of Magnesia) and magnesium citrate (Evac-Q-Mag) act similarly to the hyperosmolar laxatives.  Stimulant laxatives - Stimulant laxatives include senna (eg, Black Draught, Ex-lax, Fletcher's, Castoria, Senokot) and bisacodyl (eg, Correctol, Doxidan, Dulcolax). Some people overuse stimulant laxatives. Taking stimulant laxatives regularly or in large amounts can cause side effects, including low potassium levels. Thus, you should take these drugs carefully if you must use them regularly. However, there is no convincing evidence that using stimulant laxatives regularly damages the colon, and they do not increase the risk for colorectal cancer or other tumors.  Pills, suppositories, or enemas? - Laxatives are available as pills that you take by mouth or as suppositories or enemas that you insert into the rectum. In general, suppositories and enemas work more quickly compared to pills, but many people do not like using them.  Healthcare providers occasionally recommend prepackaged enema kits containing sodium phosphate/biphosphate (Fleet) if you have not responded to other treatments. These are not recommended if you have problems with your heart or kidneys, and should not be used more than once unless directed by your healthcare provider.  Constipation treatments to avoid ?Emollients - Emollient laxatives, principally mineral oil, soften stools by moisturizing them. However, other treatments have fewer risks and equal benefit. ?Natural products - A wide variety of natural products are advertised for constipation. Some of them contain the active ingredients found in commercially available laxatives. However, their dose and purity may not be carefully controlled. Thus, these products are not generally recommended.  A variety of home-made enema preparations have been used throughout the years,  such as soapsuds, hydrogen peroxide, and household detergents. These can be extremely irritating to the lining of the intestine and should be avoided.   BIOFEEDBACK FOR CONSTIPATION - Biofeedback is a behavioral approach that may help some people with severe chronic constipation who involuntarily squeeze (rather than relax) their muscles while having a bowel movement.

## 2017-05-01 ENCOUNTER — Telehealth: Payer: Self-pay

## 2017-05-01 LAB — URINALYSIS, MICROSCOPIC ONLY
Casts: NONE SEEN [LPF]
WBC UA: NONE SEEN WBC/HPF (ref <=5)
Yeast: NONE SEEN [HPF]

## 2017-05-01 LAB — URINE CULTURE: Organism ID, Bacteria: NO GROWTH

## 2017-05-01 NOTE — Telephone Encounter (Signed)
Pt called asking for an antibiotic for an UTI. Was seen by Dr. Sheppard Coil and was told she's constipated. During her visit there was an urinalysis completed and the culture is still pending. Please advise.

## 2017-05-03 NOTE — Telephone Encounter (Signed)
Call pt: urine culture came back negative. She doesn't have a bladder infection.

## 2017-05-04 NOTE — Telephone Encounter (Signed)
Patient already notified of this

## 2017-05-08 ENCOUNTER — Ambulatory Visit (INDEPENDENT_AMBULATORY_CARE_PROVIDER_SITE_OTHER): Payer: BLUE CROSS/BLUE SHIELD | Admitting: Family Medicine

## 2017-05-08 ENCOUNTER — Encounter: Payer: Self-pay | Admitting: Family Medicine

## 2017-05-08 VITALS — BP 131/69 | HR 74 | Wt 134.0 lb

## 2017-05-08 DIAGNOSIS — Z1231 Encounter for screening mammogram for malignant neoplasm of breast: Secondary | ICD-10-CM | POA: Diagnosis not present

## 2017-05-08 DIAGNOSIS — Z1211 Encounter for screening for malignant neoplasm of colon: Secondary | ICD-10-CM | POA: Diagnosis not present

## 2017-05-08 DIAGNOSIS — K5900 Constipation, unspecified: Secondary | ICD-10-CM | POA: Diagnosis not present

## 2017-05-08 NOTE — Progress Notes (Signed)
   Subjective:    Patient ID: Alison Wilson, female    DOB: 1950-11-16, 66 y.o.   MRN: [id number]  HPI one-week follow-up for constipation. Patient initially came in about. Because she was constipated also having abdominal pain. After reading on the back of the MiraLAX bottle that she was to consult a physician if she was having pain she actually came in. She also did a urinalysis and urine culture which was negative.She is actually feeling much better and says her bowels are moving more normally. She is up to her water intake and says she's been more cautious about her food choices.  She is also here to discuss colon cancer screening guidelines.She has never had a screening colonoscopy and she is 66 years old. No family history of colon cancer and a personal history of problems with the:.  She is also overdue for mammogram.  Review of Systems     Objective:   Physical Exam  Constitutional: She is oriented to person, place, and time. She appears well-developed and well-nourished.  HENT:  Head: Normocephalic and atraumatic.  Cardiovascular: Normal rate, regular rhythm and normal heart sounds.   Pulmonary/Chest: Effort normal and breath sounds normal.  Abdominal: Soft. Bowel sounds are normal. She exhibits no distension and no mass. There is no tenderness. There is no rebound and no guarding.  Neurological: She is alert and oriented to person, place, and time.  Skin: Skin is warm and dry.  Psychiatric: She has a normal mood and affect. Her behavior is normal.        Assessment & Plan:  Constipation-Resolved with MiraLAX. She's increased water and changed her diet.  Colon cancer screening-she's not wanting to do a colonoscopy because of procedure risks so we discussed the option of: Guard. No family history of colon cancer. Form completed today and we will fax to the company.  Due for screening mammogram-last one was in 2012. Encouraged her to schedule that today before she leaves the  building.

## 2017-05-13 ENCOUNTER — Ambulatory Visit: Payer: BLUE CROSS/BLUE SHIELD

## 2017-05-27 ENCOUNTER — Ambulatory Visit (INDEPENDENT_AMBULATORY_CARE_PROVIDER_SITE_OTHER): Payer: BLUE CROSS/BLUE SHIELD

## 2017-05-27 DIAGNOSIS — Z1231 Encounter for screening mammogram for malignant neoplasm of breast: Secondary | ICD-10-CM

## 2017-05-27 LAB — COLOGUARD: COLOGUARD: NEGATIVE

## 2017-06-02 ENCOUNTER — Telehealth: Payer: Self-pay | Admitting: Family Medicine

## 2017-06-02 NOTE — Telephone Encounter (Signed)
All patient: Alison Wilson guard was negative. Repeat colon cancer screening in 3 years

## 2017-06-03 NOTE — Telephone Encounter (Signed)
Patient advised of results and recommendations.  

## 2017-06-17 ENCOUNTER — Encounter: Payer: Self-pay | Admitting: Family Medicine

## 2017-11-03 ENCOUNTER — Ambulatory Visit (INDEPENDENT_AMBULATORY_CARE_PROVIDER_SITE_OTHER): Payer: BLUE CROSS/BLUE SHIELD | Admitting: Family Medicine

## 2017-11-03 ENCOUNTER — Encounter: Payer: Self-pay | Admitting: Family Medicine

## 2017-11-03 VITALS — BP 154/90 | HR 77 | Temp 97.9°F | Ht 66.0 in | Wt 129.0 lb

## 2017-11-03 DIAGNOSIS — J069 Acute upper respiratory infection, unspecified: Secondary | ICD-10-CM | POA: Diagnosis not present

## 2017-11-03 NOTE — Patient Instructions (Addendum)
Upper Respiratory Infection, Adult Most upper respiratory infections (URIs) are a viral infection of the air passages leading to the lungs. A URI affects the nose, throat, and upper air passages. The most common type of URI is nasopharyngitis and is typically referred to as "the common cold." URIs run their course and usually go away on their own. Most of the time, a URI does not require medical attention, but sometimes a bacterial infection in the upper airways can follow a viral infection. This is called a secondary infection. Sinus and middle ear infections are common types of secondary upper respiratory infections. Bacterial pneumonia can also complicate a URI. A URI can worsen asthma and chronic obstructive pulmonary disease (COPD). Sometimes, these complications can require emergency medical care and may be life threatening. What are the causes? Almost all URIs are caused by viruses. A virus is a type of germ and can spread from one person to another. What increases the risk? You may be at risk for a URI if:  You smoke.  You have chronic heart or lung disease.  You have a weakened defense (immune) system.  You are very young or very old.  You have nasal allergies or asthma.  You work in crowded or poorly ventilated areas.  You work in health care facilities or schools.  What are the signs or symptoms? Symptoms typically develop 2-3 days after you come in contact with a cold virus. Most viral URIs last 7-10 days. However, viral URIs from the influenza virus (flu virus) can last 14-18 days and are typically more severe. Symptoms may include:  Runny or stuffy (congested) nose.  Sneezing.  Cough.  Sore throat.  Headache.  Fatigue.  Fever.  Loss of appetite.  Pain in your forehead, behind your eyes, and over your cheekbones (sinus pain).  Muscle aches.  How is this diagnosed? Your health care provider may diagnose a URI by:  Physical exam.  Tests to check that your  symptoms are not due to another condition such as: ? Strep throat. ? Sinusitis. ? Pneumonia. ? Asthma.  How is this treated? A URI goes away on its own with time. It cannot be cured with medicines, but medicines may be prescribed or recommended to relieve symptoms. Medicines may help:  Reduce your fever.  Reduce your cough.  Relieve nasal congestion.  Follow these instructions at home:  Take medicines only as directed by your health care provider.  Gargle warm saltwater or take cough drops to comfort your throat as directed by your health care provider.  Use a warm mist humidifier or inhale steam from a shower to increase air moisture. This may make it easier to breathe.  Drink enough fluid to keep your urine clear or pale yellow.  Eat soups and other clear broths and maintain good nutrition.  Rest as needed.  Return to work when your temperature has returned to normal or as your health care provider advises. You may need to stay home longer to avoid infecting others. You can also use a face mask and careful hand washing to prevent spread of the virus.  Increase the usage of your inhaler if you have asthma.  Do not use any tobacco products, including cigarettes, chewing tobacco, or electronic cigarettes. If you need help quitting, ask your health care provider. How is this prevented? The best way to protect yourself from getting a cold is to practice good hygiene.  Avoid oral or hand contact with people with cold symptoms.  Wash your   hands often if contact occurs.  There is no clear evidence that vitamin C, vitamin E, echinacea, or exercise reduces the chance of developing a cold. However, it is always recommended to get plenty of rest, exercise, and practice good nutrition. Contact a health care provider if:  You are getting worse rather than better.  Your symptoms are not controlled by medicine.  You have chills.  You have worsening shortness of breath.  You have  brown or red mucus.  You have yellow or brown nasal discharge.  You have pain in your face, especially when you bend forward.  You have a fever.  You have swollen neck glands.  You have pain while swallowing.  You have white areas in the back of your throat. Get help right away if:  You have severe or persistent: ? Headache. ? Ear pain. ? Sinus pain. ? Chest pain.  You have chronic lung disease and any of the following: ? Wheezing. ? Prolonged cough. ? Coughing up blood. ? A change in your usual mucus.  You have a stiff neck.  You have changes in your: ? Vision. ? Hearing. ? Thinking. ? Mood. This information is not intended to replace advice given to you by your health care provider. Make sure you discuss any questions you have with your health care provider. Document Released: 03/25/2001 Document Revised: 06/01/2016 Document Reviewed: 01/04/2014 Elsevier Interactive Patient Education  2018 Elsevier Inc.  

## 2017-11-03 NOTE — Progress Notes (Signed)
   Subjective:    Patient ID: Alison Wilson, female    DOB: June 26, 1951, 67 y.o.   MRN: [id number]  HPI 67 year old female comes in today complaining of a cough that started yesterday but she is also had sinus congestion and runny nose and body aches since Saturday, for about 4 days.  Did run a fever on Sunday night and had some chills and body aches.  Mostly in her shoulders and legs.  She has had some bilateral ear pain, chills and headache.  Decreased appetite.  She actually feels a little bit better today.  The body aches seem to have resolved and she is now been afebrile for about 48 hours.   Review of Systems     Objective:   Physical Exam  Constitutional: She is oriented to person, place, and time. She appears well-developed and well-nourished.  HENT:  Head: Normocephalic and atraumatic.  Right Ear: External ear normal.  Left Ear: External ear normal.  Nose: Nose normal.  Mouth/Throat: Oropharynx is clear and moist.  TMs and canals are clear.   Eyes: Conjunctivae and EOM are normal. Pupils are equal, round, and reactive to light.  Neck: Neck supple. No thyromegaly present.  Cardiovascular: Normal rate, regular rhythm and normal heart sounds.  Pulmonary/Chest: Effort normal and breath sounds normal. She has no wheezes.  Lymphadenopathy:    She has no cervical adenopathy.  Neurological: She is alert and oriented to person, place, and time.  Skin: Skin is warm and dry.  Psychiatric: She has a normal mood and affect.       Assessment & Plan:  Upper respiratory infection-likely viral.  If she is not continuing to feel better in the next couple of days and will call in a Z-Pak for her she will let us know.  In the meantime continue with hydration, rest and okay to use over-the-counter medications if she would like.  She really does not like to use nasal saline so she can avoid that.

## 2017-11-06 ENCOUNTER — Telehealth: Payer: Self-pay

## 2017-11-06 MED ORDER — AZITHROMYCIN 250 MG PO TABS: ORAL_TABLET | ORAL | 0 refills | Status: AC

## 2017-11-06 NOTE — Telephone Encounter (Signed)
Alison Wilson called and states she was advised to call back if she wasn't feeling any better. She now has chest congestion and runny nose. She still has ear pain but is slightly better. Please advise.

## 2017-11-06 NOTE — Telephone Encounter (Signed)
Scription sent for Z-Pak to [facility].

## 2017-11-09 NOTE — Telephone Encounter (Signed)
Left VM advising of Rx.

## 2019-03-17 ENCOUNTER — Ambulatory Visit: Payer: BLUE CROSS/BLUE SHIELD | Admitting: Family Medicine

## 2019-08-17 ENCOUNTER — Ambulatory Visit (INDEPENDENT_AMBULATORY_CARE_PROVIDER_SITE_OTHER): Payer: Medicare Other | Admitting: Family Medicine

## 2019-08-17 DIAGNOSIS — Z23 Encounter for immunization: Secondary | ICD-10-CM

## 2019-08-17 NOTE — Progress Notes (Signed)
Flu given

## 2020-01-24 ENCOUNTER — Encounter: Payer: Self-pay | Admitting: Family Medicine

## 2020-01-24 ENCOUNTER — Other Ambulatory Visit: Payer: Self-pay

## 2020-01-24 ENCOUNTER — Ambulatory Visit (INDEPENDENT_AMBULATORY_CARE_PROVIDER_SITE_OTHER): Payer: Medicare Other | Admitting: Family Medicine

## 2020-01-24 VITALS — BP 172/80 | HR 80 | Temp 99.1°F | Ht 66.0 in | Wt 135.0 lb

## 2020-01-24 DIAGNOSIS — N907 Vulvar cyst: Secondary | ICD-10-CM | POA: Diagnosis not present

## 2020-01-24 DIAGNOSIS — R030 Elevated blood-pressure reading, without diagnosis of hypertension: Secondary | ICD-10-CM | POA: Diagnosis not present

## 2020-01-24 MED ORDER — DOXYCYCLINE HYCLATE 100 MG PO TABS: 100.0000 mg | ORAL_TABLET | Freq: Two times a day (BID) | ORAL | 0 refills | Status: DC

## 2020-01-24 NOTE — Progress Notes (Signed)
Acute Office Visit  Subjective:    Patient ID: Alison Wilson, female    DOB: 06/03/1951, 69 y.o.   MRN: [id number]  Chief Complaint  Patient presents with  . Gynecologic Exam    HPI Patient is in today for cyst on her labia.  Noticed it around December.  Says not initially painful. 3 week ago it became sore, larger, and hard and started poking at it and squeezing it.  No fever or chills.      Her brother has cancer and he is moving from [facility] to live with her.  This is put some extra stress on her and she also received her Covid vaccine recently.  Past Medical History:  Diagnosis Date  . Fibrocystic breast   . Herpes 1994    Past Surgical History:  Procedure Laterality Date  . ABDOMINAL HYSTERECTOMY  01/1995   fibroid, still has her ovaries.   . Cyst on left ovary removed  1996  . right breast biopsy  2002  . TONSILLECTOMY AND ADENOIDECTOMY  1957    History reviewed. No pertinent family history.  Social History   Socioeconomic History  . Marital status: Married    Spouse name: [name]  . Number of children: 2  . Years of education: Not on file  . Highest education level: Not on file  Occupational History  . Occupation: Retired  Tobacco Use  . Smoking status: Current Every Day Smoker    Packs/day: 1.00    Types: Cigarettes  . Smokeless tobacco: Never Used  Substance and Sexual Activity  . Alcohol use: No  . Drug use: No  . Sexual activity: Not Currently  Other Topics Concern  . Not on file  Social History Narrative  . Not on file   Social Determinants of Health   Financial Resource Strain:   . Difficulty of Paying Living Expenses:   Food Insecurity:   . Worried About Charity fundraiser in the Last Year:   . Arboriculturist in the Last Year:   Transportation Needs:   . Film/video editor (Medical):   Marland Kitchen Lack of Transportation (Non-Medical):   Physical Activity:   . Days of Exercise per Week:   . Minutes of Exercise per Session:   Stress:   .  Feeling of Stress :   Social Connections:   . Frequency of Communication with Friends and Family:   . Frequency of Social Gatherings with Friends and Family:   . Attends Religious Services:   . Active Member of Clubs or Organizations:   . Attends Archivist Meetings:   Marland Kitchen Marital Status:   Intimate Partner Violence:   . Fear of Current or Ex-Partner:   . Emotionally Abused:   Marland Kitchen Physically Abused:   . Sexually Abused:     No outpatient medications prior to visit.   No facility-administered medications prior to visit.    Allergies  Allergen Reactions  . Dexilant [Dexlansoprazole] Other (See Comments)    Pt says felt more SOB on med but was also being tx'd for bronchitis at the time.   . Cephalexin Rash    Review of Systems     Objective:    Physical Exam Vitals reviewed.  Constitutional:      Appearance: She is well-developed.  HENT:     Head: Normocephalic and atraumatic.  Eyes:     Conjunctiva/sclera: Conjunctivae normal.  Cardiovascular:     Rate and Rhythm: Normal rate.  Pulmonary:  Effort: Pulmonary effort is normal.  Genitourinary:    Comments: On the right labia she has an approximately 2-1/2 to 3 cm epidermal cyst.  No active drainage on exam.  He does have some mild erythema around the skin. Skin:    General: Skin is dry.     Coloration: Skin is not pale.  Neurological:     Mental Status: She is alert and oriented to person, place, and time.  Psychiatric:        Behavior: Behavior normal.     BP (!) 172/80   Pulse 80   Temp 99.1 F (37.3 C)   Ht 5\' 6"  (1.676 m)   Wt 135 lb (61.2 kg)   SpO2 98%   BMI 21.79 kg/m  Wt Readings from Last 3 Encounters:  01/24/20 135 lb (61.2 kg)  11/03/17 129 lb (58.5 kg)  05/08/17 134 lb (60.8 kg)    Health Maintenance Due  Topic Date Due  . Hepatitis C Screening  Never done  . DEXA SCAN  Never done  . PNA vac Low Risk Adult (1 of 2 - PCV13) Never done  . MAMMOGRAM  05/28/2019    There are  no preventive care reminders to display for this patient.   No results found for: TSH Lab Results  Component Value Date   WBC 10.0 03/08/2014   HGB 16.2 (H) 03/08/2014   HCT 47.9 (H) 03/08/2014   MCV 89.4 03/08/2014   PLT 241 03/08/2014   Lab Results  Component Value Date   NA 137 03/08/2014   K 4.1 03/08/2014   CO2 24 03/08/2014   GLUCOSE 102 (H) 03/08/2014   BUN 12 03/08/2014   CREATININE 0.85 03/08/2014   BILITOT 0.7 03/08/2014   ALKPHOS 60 03/08/2014   AST 20 03/08/2014   ALT 12 03/08/2014   PROT 6.8 03/08/2014   ALBUMIN 4.6 03/08/2014   CALCIUM 10.4 03/08/2014   Lab Results  Component Value Date   CHOL 204 (H) 08/25/2013   Lab Results  Component Value Date   HDL 55 08/25/2013   Lab Results  Component Value Date   LDLCALC 132 (H) 08/25/2013   Lab Results  Component Value Date   TRIG 84 08/25/2013   Lab Results  Component Value Date   CHOLHDL 3.7 08/25/2013   Lab Results  Component Value Date   HGBA1C 5.6 10/28/2012       Assessment & Plan:   Problem List Items Addressed This Visit    None    Visit Diagnoses    Epidermal cyst of vulva    -  Primary   Elevated BP without diagnosis of hypertension          Cyst on her labia most consistent with an epidermal cyst.  I was actually able to remove most of the wall.  I did not close the incision and will let it heal by secondary intention.  We discussed how to clean the area since it is on the right labia.  I am also can place her on doxycycline just for 5 days while is healing again just because it is in the labial area and may get exposed to urine or feces.  If any point she has any concerns about the healing process then please let us know.  Elevated blood pressure without a diagnosis of hypertension again she was nervous today and under a lot of stress.  Plan to have her come back in about 2 weeks to recheck her pressure.  Did encourage her to cut back on caffeine intake and recommend following the  DASH diet.  Additional information provided.   Cyst Removal Note  Pre-operative Diagnosis: cyst.    Post-operative Diagnosis: same  Indications: pain and drainage   Anesthesia: ethyl chloride spray  Procedure Details  The procedure, risks and complications have been discussed in detail (including, but not limited to airway compromise, infection, bleeding) with the patient, and the patient has signed consent to the procedure.  The skin was sterilely prepped and draped over the affected area in the usual fashion. After adequate local anesthesia, I&D with a #15 blade was performed on the right labia. Purulent drainage: present - small amt .  The wall of the cyst actually came apart from the tissue pretty easily so I feel confident that we were able to actually remove the entire lesion. The patient was observed until stable.  Findings: Epidermal cyst.    EBL: trace blood  Drains: none place.    Condition: Tolerated procedure well   Complications: none.    Meds ordered this encounter  Medications  . doxycycline (VIBRA-TABS) 100 MG tablet    Sig: Take 1 tablet (100 mg total) by mouth 2 (two) times daily.    Dispense:  10 tablet    Refill:  0      Beatrice Lecher, MD

## 2020-01-24 NOTE — Patient Instructions (Addendum)
After urinating or bowel movement please just rinse the area with some warm water.  Pat dry.  Okay to keep gauze in between a pad in the skin just to absorb any drainage or discharge.  Start applying Vaseline to the area twice a day, starting tomorrow morning.  Continue to apply for 2 weeks total.     DASH Eating Plan DASH stands for "Dietary Approaches to Stop Hypertension." The DASH eating plan is a healthy eating plan that has been shown to reduce high blood pressure (hypertension). It may also reduce your risk for type 2 diabetes, heart disease, and stroke. The DASH eating plan may also help with weight loss. What are tips for following this plan?  General guidelines  Avoid eating more than 2,300 mg (milligrams) of salt (sodium) a day. If you have hypertension, you may need to reduce your sodium intake to 1,500 mg a day.  Limit alcohol intake to no more than 1 drink a day for nonpregnant women and 2 drinks a day for men. One drink equals 12 oz of beer, 5 oz of wine, or 1 oz of hard liquor.  Work with your health care provider to maintain a healthy body weight or to lose weight. Ask what an ideal weight is for you.  Get at least 30 minutes of exercise that causes your heart to beat faster (aerobic exercise) most days of the week. Activities may include walking, swimming, or biking.  Work with your health care provider or diet and nutrition specialist (dietitian) to adjust your eating plan to your individual calorie needs. Reading food labels   Check food labels for the amount of sodium per serving. Choose foods with less than 5 percent of the Daily Value of sodium. Generally, foods with less than 300 mg of sodium per serving fit into this eating plan.  To find whole grains, look for the word "whole" as the first word in the ingredient list. Shopping  Buy products labeled as "low-sodium" or "no salt added."  Buy fresh foods. Avoid canned foods and premade or frozen  meals. Cooking  Avoid adding salt when cooking. Use salt-free seasonings or herbs instead of table salt or sea salt. Check with your health care provider or pharmacist before using salt substitutes.  Do not fry foods. Cook foods using healthy methods such as baking, boiling, grilling, and broiling instead.  Cook with heart-healthy oils, such as olive, canola, soybean, or sunflower oil. Meal planning  Eat a balanced diet that includes: ? 5 or more servings of fruits and vegetables each day. At each meal, try to fill half of your plate with fruits and vegetables. ? Up to 6-8 servings of whole grains each day. ? Less than 6 oz of lean meat, poultry, or fish each day. A 3-oz serving of meat is about the same size as a deck of cards. One egg equals 1 oz. ? 2 servings of low-fat dairy each day. ? A serving of nuts, seeds, or beans 5 times each week. ? Heart-healthy fats. Healthy fats called Omega-3 fatty acids are found in foods such as flaxseeds and coldwater fish, like sardines, salmon, and mackerel.  Limit how much you eat of the following: ? Canned or prepackaged foods. ? Food that is high in trans fat, such as fried foods. ? Food that is high in saturated fat, such as fatty meat. ? Sweets, desserts, sugary drinks, and other foods with added sugar. ? Full-fat dairy products.  Do not salt foods before eating.  Try to eat at least 2 vegetarian meals each week.  Eat more home-cooked food and less restaurant, buffet, and fast food.  When eating at a restaurant, ask that your food be prepared with less salt or no salt, if possible. What foods are recommended? The items listed may not be a complete list. Talk with your dietitian about what dietary choices are best for you. Grains Whole-grain or whole-wheat bread. Whole-grain or whole-wheat pasta. Brown rice. [place]. Bulgur. Whole-grain and low-sodium cereals. Pita bread. Low-fat, low-sodium crackers. Whole-wheat flour  tortillas. Vegetables Fresh or frozen vegetables (raw, steamed, roasted, or grilled). Low-sodium or reduced-sodium tomato and vegetable juice. Low-sodium or reduced-sodium tomato sauce and tomato paste. Low-sodium or reduced-sodium canned vegetables. Fruits All fresh, dried, or frozen fruit. Canned fruit in natural juice (without added sugar). Meat and other protein foods Skinless chicken or [place]. Ground chicken or [place]. Pork with fat trimmed off. Fish and seafood. Egg whites. Dried beans, peas, or lentils. Unsalted nuts, nut butters, and seeds. Unsalted canned beans. Lean cuts of beef with fat trimmed off. Low-sodium, lean deli meat. Dairy Low-fat (1%) or fat-free (skim) milk. Fat-free, low-fat, or reduced-fat cheeses. Nonfat, low-sodium ricotta or cottage cheese. Low-fat or nonfat yogurt. Low-fat, low-sodium cheese. Fats and oils Soft margarine without trans fats. Vegetable oil. Low-fat, reduced-fat, or light mayonnaise and salad dressings (reduced-sodium). Canola, safflower, olive, soybean, and sunflower oils. Avocado. Seasoning and other foods Herbs. Spices. Seasoning mixes without salt. Unsalted popcorn and pretzels. Fat-free sweets. What foods are not recommended? The items listed may not be a complete list. Talk with your dietitian about what dietary choices are best for you. Grains Baked goods made with fat, such as croissants, muffins, or some breads. Dry pasta or rice meal packs. Vegetables Creamed or fried vegetables. Vegetables in a cheese sauce. Regular canned vegetables (not low-sodium or reduced-sodium). Regular canned tomato sauce and paste (not low-sodium or reduced-sodium). Regular tomato and vegetable juice (not low-sodium or reduced-sodium). Angie Fava. Olives. Fruits Canned fruit in a light or heavy syrup. Fried fruit. Fruit in cream or butter sauce. Meat and other protein foods Fatty cuts of meat. Ribs. Fried meat. Berniece Salines. Sausage. Bologna and other processed lunch meats.  Salami. Fatback. Hotdogs. Bratwurst. Salted nuts and seeds. Canned beans with added salt. Canned or smoked fish. Whole eggs or egg yolks. Chicken or [place] with skin. Dairy Whole or 2% milk, cream, and half-and-half. Whole or full-fat cream cheese. Whole-fat or sweetened yogurt. Full-fat cheese. Nondairy creamers. Whipped toppings. Processed cheese and cheese spreads. Fats and oils Butter. Stick margarine. Lard. Shortening. Ghee. Bacon fat. Tropical oils, such as coconut, palm kernel, or palm oil. Seasoning and other foods Salted popcorn and pretzels. Onion salt, garlic salt, seasoned salt, table salt, and sea salt. Worcestershire sauce. Tartar sauce. Barbecue sauce. Teriyaki sauce. Soy sauce, including reduced-sodium. Steak sauce. Canned and packaged gravies. Fish sauce. Oyster sauce. Cocktail sauce. Horseradish that you find on the shelf. Ketchup. Mustard. Meat flavorings and tenderizers. Bouillon cubes. Hot sauce and Tabasco sauce. Premade or packaged marinades. Premade or packaged taco seasonings. Relishes. Regular salad dressings. Where to find more information:  National Heart, Lung, and [place]: https://wilson-eaton.com/  American Heart Association: www.heart.org Summary  The DASH eating plan is a healthy eating plan that has been shown to reduce high blood pressure (hypertension). It may also reduce your risk for type 2 diabetes, heart disease, and stroke.  With the DASH eating plan, you should limit salt (sodium) intake to 2,300 mg a day. If  you have hypertension, you may need to reduce your sodium intake to 1,500 mg a day.  When on the DASH eating plan, aim to eat more fresh fruits and vegetables, whole grains, lean proteins, low-fat dairy, and heart-healthy fats.  Work with your health care provider or diet and nutrition specialist (dietitian) to adjust your eating plan to your individual calorie needs. This information is not intended to replace advice given to you by your health  care provider. Make sure you discuss any questions you have with your health care provider. Document Revised: 09/11/2017 Document Reviewed: 09/22/2016 Elsevier Patient Education  2020 [employer].

## 2020-01-27 ENCOUNTER — Ambulatory Visit: Payer: Medicare Other | Admitting: Family Medicine

## 2020-02-07 ENCOUNTER — Other Ambulatory Visit: Payer: Self-pay

## 2020-02-07 ENCOUNTER — Ambulatory Visit (INDEPENDENT_AMBULATORY_CARE_PROVIDER_SITE_OTHER): Payer: Medicare Other | Admitting: Family Medicine

## 2020-02-07 ENCOUNTER — Telehealth: Payer: Self-pay

## 2020-02-07 VITALS — BP 139/71 | HR 72 | Ht 66.0 in | Wt 134.0 lb

## 2020-02-07 DIAGNOSIS — R030 Elevated blood-pressure reading, without diagnosis of hypertension: Secondary | ICD-10-CM

## 2020-02-07 DIAGNOSIS — Z1231 Encounter for screening mammogram for malignant neoplasm of breast: Secondary | ICD-10-CM

## 2020-02-07 DIAGNOSIS — Z780 Asymptomatic menopausal state: Secondary | ICD-10-CM | POA: Diagnosis not present

## 2020-02-07 DIAGNOSIS — N907 Vulvar cyst: Secondary | ICD-10-CM

## 2020-02-07 NOTE — Patient Instructions (Signed)
Looks like your home blood pressure cuff is probably pretty accurate.  So lets just continue to monitor your blood pressure twice a week over the next 2 months.  If you can write those down on a piece of paper and then bring them back in with you for your follow-up.  Continue to work on the [employer] and getting in some regular exercise which helps reduce your blood pressure by about 5-10 points.  We can make a judgment call at that time if we need to consider medication or not.

## 2020-02-07 NOTE — Progress Notes (Signed)
Established Patient Office Visit  Subjective:  Patient ID: Alison Wilson, female    DOB: 04-20-1951  Age: 69 y.o. MRN: [id number]  CC:  Chief Complaint  Patient presents with  . Hypertension    home bp @ 830 AM:134/76 p: 76 in office 165/86 p: 84    HPI Deva Maltez presents for    F/U for evluation of labial cyst removal wound site.  She actually says she feels like it is healing really well she has not had any problems or complications she has felt a slight knot in the location of the wound.  No active drainage.  Also here to follow-up for recently elevated blood pressure level shows no prior history of hypertension but her blood pressure was 170.  She did get her own home blood pressure cuff and has been checking them at home she is been mostly getting in the 130s at home and has been doing well with it.  She says that over the last year she and her husband and switch to a lot of canned vegetables because they were worried about the spread of Covid.  So she knows that probably increased her overall salt intake even though she does not typically add while cooking.  She also cut back significantly on her caffeine intake so she is only drinking about half of what she was previously and she is even trying a little bit of decaf.  She did look over the DASH diet information I provided.  Is also due for mammogram and bone density testing.  Past Medical History:  Diagnosis Date  . Fibrocystic breast   . Herpes 1994    Past Surgical History:  Procedure Laterality Date  . ABDOMINAL HYSTERECTOMY  01/1995   fibroid, still has her ovaries.   . Cyst on left ovary removed  1996  . right breast biopsy  2002  . TONSILLECTOMY AND ADENOIDECTOMY  1957    No family history on file.  Social History   Socioeconomic History  . Marital status: Married    Spouse name: [name]  . Number of children: 2  . Years of education: Not on file  . Highest education level: Not on file  Occupational History   . Occupation: Retired  Tobacco Use  . Smoking status: Current Every Day Smoker    Packs/day: 1.00    Types: Cigarettes  . Smokeless tobacco: Never Used  Substance and Sexual Activity  . Alcohol use: No  . Drug use: No  . Sexual activity: Not Currently  Other Topics Concern  . Not on file  Social History Narrative  . Not on file   Social Determinants of Health   Financial Resource Strain:   . Difficulty of Paying Living Expenses:   Food Insecurity:   . Worried About Charity fundraiser in the Last Year:   . Arboriculturist in the Last Year:   Transportation Needs:   . Film/video editor (Medical):   Marland Kitchen Lack of Transportation (Non-Medical):   Physical Activity:   . Days of Exercise per Week:   . Minutes of Exercise per Session:   Stress:   . Feeling of Stress :   Social Connections:   . Frequency of Communication with Friends and Family:   . Frequency of Social Gatherings with Friends and Family:   . Attends Religious Services:   . Active Member of Clubs or Organizations:   . Attends Archivist Meetings:   Marland Kitchen Marital Status:  Intimate Partner Violence:   . Fear of Current or Ex-Partner:   . Emotionally Abused:   Marland Kitchen Physically Abused:   . Sexually Abused:     Outpatient Medications Prior to Visit  Medication Sig Dispense Refill  . doxycycline (VIBRA-TABS) 100 MG tablet Take 1 tablet (100 mg total) by mouth 2 (two) times daily. 10 tablet 0   No facility-administered medications prior to visit.    Allergies  Allergen Reactions  . Dexilant [Dexlansoprazole] Other (See Comments)    Pt says felt more SOB on med but was also being tx'd for bronchitis at the time.   . Cephalexin Rash    ROS Review of Systems    Objective:    Physical Exam  Constitutional: She is oriented to person, place, and time. She appears well-developed and well-nourished.  HENT:  Head: Normocephalic and atraumatic.  Cardiovascular: Normal rate, regular rhythm and normal  heart sounds.  Pulmonary/Chest: Effort normal and breath sounds normal.  Neurological: She is alert and oriented to person, place, and time.  Skin: Skin is warm and dry.  Incision of her right labia is well-healed.  I do feel a little bit of a knot underneath the skin but no active drainage or erythema.  Psychiatric: She has a normal mood and affect. Her behavior is normal.    BP 139/71   Pulse 72   Ht 5\' 6"  (1.676 m)   Wt 134 lb (60.8 kg)   SpO2 96%   BMI 21.63 kg/m  Wt Readings from Last 3 Encounters:  02/07/20 134 lb (60.8 kg)  01/24/20 135 lb (61.2 kg)  11/03/17 129 lb (58.5 kg)     Health Maintenance Due  Topic Date Due  . Hepatitis C Screening  Never done  . COVID-19 Vaccine (1) Never done  . DEXA SCAN  Never done  . PNA vac Low Risk Adult (1 of 2 - PCV13) Never done  . MAMMOGRAM  05/28/2019    There are no preventive care reminders to display for this patient.  No results found for: TSH Lab Results  Component Value Date   WBC 10.0 03/08/2014   HGB 16.2 (H) 03/08/2014   HCT 47.9 (H) 03/08/2014   MCV 89.4 03/08/2014   PLT 241 03/08/2014   Lab Results  Component Value Date   NA 137 03/08/2014   K 4.1 03/08/2014   CO2 24 03/08/2014   GLUCOSE 102 (H) 03/08/2014   BUN 12 03/08/2014   CREATININE 0.85 03/08/2014   BILITOT 0.7 03/08/2014   ALKPHOS 60 03/08/2014   AST 20 03/08/2014   ALT 12 03/08/2014   PROT 6.8 03/08/2014   ALBUMIN 4.6 03/08/2014   CALCIUM 10.4 03/08/2014   Lab Results  Component Value Date   CHOL 204 (H) 08/25/2013   Lab Results  Component Value Date   HDL 55 08/25/2013   Lab Results  Component Value Date   LDLCALC 132 (H) 08/25/2013   Lab Results  Component Value Date   TRIG 84 08/25/2013   Lab Results  Component Value Date   CHOLHDL 3.7 08/25/2013   Lab Results  Component Value Date   HGBA1C 5.6 10/28/2012      Assessment & Plan:   Problem List Items Addressed This Visit    None    Visit Diagnoses    Epidermal  cyst of vulva    -  Primary   Elevated BP without diagnosis of hypertension       Post-menopausal  Relevant Orders   DG Bone Density   Screening mammogram, encounter for       Relevant Orders   MM 3D SCREEN BREAST BILATERAL      Epidermal cyst of vulva-follow-up wound check looks great.  Incision is well-healed in fact is almost unnoticeable.  There is a little bit of firmness of the tissue underneath which could be scar tissue or possible reformation of the cyst though I did fill fairly confident that it was completely removed at last visit so we will keep an eye on it.  Elevated blood pressure without diagnosis of hypertension-blood pressure is significantly better today though still little borderline though it sounds like she is getting some better pressures at home.  We discussed continuing to monitor and work on [employer] as well as increased activity and exercise over the next 2 months and following up at that time to decide whether or not she needs to be on medication or not.  No orders of the defined types were placed in this encounter.   Follow-up: Return in about 2 months (around 04/08/2020) for f/u BP .    Beatrice Lecher, MD

## 2020-02-07 NOTE — Telephone Encounter (Signed)
Meta called. She states she has finished the Doxycycline for 10 days. She wanted to know if she should have another round of Doxycycline.

## 2020-02-08 NOTE — Telephone Encounter (Signed)
No, she should be good

## 2020-02-08 NOTE — Telephone Encounter (Signed)
Left a message advising of recommendations.  

## 2021-10-02 DIAGNOSIS — R2689 Other abnormalities of gait and mobility: Secondary | ICD-10-CM | POA: Diagnosis not present

## 2021-10-02 DIAGNOSIS — Z888 Allergy status to other drugs, medicaments and biological substances status: Secondary | ICD-10-CM | POA: Diagnosis not present

## 2021-10-02 DIAGNOSIS — M25562 Pain in left knee: Secondary | ICD-10-CM | POA: Diagnosis not present

## 2021-10-02 DIAGNOSIS — M1712 Unilateral primary osteoarthritis, left knee: Secondary | ICD-10-CM | POA: Diagnosis not present

## 2021-10-02 DIAGNOSIS — G8911 Acute pain due to trauma: Secondary | ICD-10-CM | POA: Diagnosis not present

## 2021-10-02 DIAGNOSIS — M25462 Effusion, left knee: Secondary | ICD-10-CM | POA: Diagnosis not present

## 2021-10-02 DIAGNOSIS — M11262 Other chondrocalcinosis, left knee: Secondary | ICD-10-CM | POA: Diagnosis not present

## 2021-10-04 ENCOUNTER — Ambulatory Visit (INDEPENDENT_AMBULATORY_CARE_PROVIDER_SITE_OTHER): Payer: Medicare Other | Admitting: Sports Medicine

## 2021-10-04 ENCOUNTER — Ambulatory Visit (INDEPENDENT_AMBULATORY_CARE_PROVIDER_SITE_OTHER): Payer: Medicare Other

## 2021-10-04 ENCOUNTER — Other Ambulatory Visit: Payer: Self-pay

## 2021-10-04 DIAGNOSIS — M25462 Effusion, left knee: Secondary | ICD-10-CM

## 2021-10-04 NOTE — Assessment & Plan Note (Signed)
This is a pleasant 71 year old female, she was seen in the ED with rapid onset swelling of the left knee, moderate pain. No trauma. X-rays in the ED showed CPPD as well as osteoarthritis, she was discharged with follow-up with me. On exam she has a moderate effusion, we did an ultrasound-guided aspiration and injection today, this will be sent off for crystal analysis, cell counts. I would like to see her back in about 4 weeks.

## 2021-10-04 NOTE — Progress Notes (Signed)
° ° °  Procedures performed today:    Procedure: Real-time Ultrasound Guided aspiration/injection of left knee Device: Samsung HS60  Verbal informed consent obtained.  Time-out conducted.  Noted no overlying erythema, induration, or other signs of local infection.  Skin prepped in a sterile fashion.  Local anesthesia: Topical Ethyl chloride.  With sterile technique and under real time ultrasound guidance: Using an 18-gauge needle advanced into the suprapatellar recess effusion, aspirated approximately 25 mL of slightly cloudy, straw-colored fluid, syringe switched and 1 cc Kenalog 40, 2 cc lidocaine, 2 cc bupivacaine injected easily Completed without difficulty  Advised to call if fevers/chills, erythema, induration, drainage, or persistent bleeding.  Images permanently stored and available for review in PACS.  Impression: Technically successful ultrasound guided aspiration/injection.  Independent interpretation of notes and tests performed by another provider:   None.  Brief History, Exam, Impression, and Recommendations:    Swelling of joint, knee, left This is a pleasant 70 year old female, she was seen in the ED with rapid onset swelling of the left knee, moderate pain. No trauma. X-rays in the ED showed CPPD as well as osteoarthritis, she was discharged with follow-up with me. On exam she has a moderate effusion, we did an ultrasound-guided aspiration and injection today, this will be sent off for crystal analysis, cell counts. I would like to see her back in about 4 weeks.    ___________________________________________ Gwen Her. Dianah Field, M.D., ABFM., CAQSM. Primary Care and [place] Instructor of [place] [facility]

## 2021-10-05 LAB — CELL COUNT AND DIFF, FLUID, OTHER
Basophils, %: 0 %
Eosinophils, %: 0 %
Lymphocytes, %: 20 %
Mesothelial, %: 0 %
Monocyte/Macrophage %: 10 %
Neutrophils, %: 70 %
Total Nucleated Cell Ct: 4426 cells/uL

## 2021-10-05 LAB — SYNOVIAL FLUID, CRYSTAL

## 2021-11-01 ENCOUNTER — Other Ambulatory Visit: Payer: Self-pay

## 2021-11-01 ENCOUNTER — Ambulatory Visit (INDEPENDENT_AMBULATORY_CARE_PROVIDER_SITE_OTHER): Payer: Medicare Other | Admitting: Sports Medicine

## 2021-11-01 DIAGNOSIS — Z23 Encounter for immunization: Secondary | ICD-10-CM | POA: Diagnosis not present

## 2021-11-01 DIAGNOSIS — Z0000 Encounter for general adult medical examination without abnormal findings: Secondary | ICD-10-CM | POA: Insufficient documentation

## 2021-11-01 DIAGNOSIS — M25462 Effusion, left knee: Secondary | ICD-10-CM

## 2021-11-01 DIAGNOSIS — Z1211 Encounter for screening for malignant neoplasm of colon: Secondary | ICD-10-CM

## 2021-11-01 NOTE — Assessment & Plan Note (Signed)
Alison Wilson returns, she is doing really well after aspiration and injection of her left knee the last visit, when she does get some twinges of pain usually higher up in the hip, a single ibuprofen over-the-counter takes care of it. Thanks of hip conditioning, return as needed.

## 2021-11-01 NOTE — Progress Notes (Signed)
° ° °  Procedures performed today:    None.  Independent interpretation of notes and tests performed by another provider:   None.  Brief History, Exam, Impression, and Recommendations:    Swelling of joint, knee, left Alison Wilson returns, she is doing really well after aspiration and injection of Wilson left knee the last visit, when she does get some twinges of pain usually higher up in the hip, a single ibuprofen over-the-counter takes care of it. Thanks of hip conditioning, return as needed.  Annual physical exam Patient of Dr. Madilyn Fireman, she is requesting a flu shot, will do the high-dose flu shot today, she is also requesting Cologuard, she has aged out of colon cancer screening however I do think she has enough quality life ahead where we can go ahead and get a Cologuard per Wilson request.  I spent 30 minutes of total time managing this patient today, this includes chart review, face to face, and non-face to face time.  We talked extensively about screening and some of Wilson other medical problems.  ___________________________________________ Alison Wilson. Dianah Field, M.D., ABFM., CAQSM. Primary Care and [place] Instructor of [place] [facility] Medicine

## 2021-11-01 NOTE — Assessment & Plan Note (Signed)
Patient of Dr. Madilyn Fireman, she is requesting a flu shot, will do the high-dose flu shot today, she is also requesting Cologuard, she has aged out of colon cancer screening however I do think she has enough quality life ahead where we can go ahead and get a Cologuard per her request.

## 2021-11-04 ENCOUNTER — Ambulatory Visit: Payer: Medicare Other | Admitting: Sports Medicine

## 2021-12-04 ENCOUNTER — Other Ambulatory Visit: Payer: Self-pay

## 2021-12-04 ENCOUNTER — Ambulatory Visit (INDEPENDENT_AMBULATORY_CARE_PROVIDER_SITE_OTHER): Payer: Medicare Other | Admitting: Family Medicine

## 2021-12-04 ENCOUNTER — Encounter: Payer: Self-pay | Admitting: Family Medicine

## 2021-12-04 VITALS — BP 165/84 | HR 76 | Resp 16 | Ht 66.0 in | Wt 127.0 lb

## 2021-12-04 DIAGNOSIS — E2839 Other primary ovarian failure: Secondary | ICD-10-CM | POA: Diagnosis not present

## 2021-12-04 DIAGNOSIS — Z23 Encounter for immunization: Secondary | ICD-10-CM | POA: Diagnosis not present

## 2021-12-04 DIAGNOSIS — Z1231 Encounter for screening mammogram for malignant neoplasm of breast: Secondary | ICD-10-CM | POA: Diagnosis not present

## 2021-12-04 DIAGNOSIS — Z780 Asymptomatic menopausal state: Secondary | ICD-10-CM | POA: Diagnosis not present

## 2021-12-04 DIAGNOSIS — E785 Hyperlipidemia, unspecified: Secondary | ICD-10-CM

## 2021-12-04 DIAGNOSIS — I10 Essential (primary) hypertension: Secondary | ICD-10-CM

## 2021-12-04 NOTE — Patient Instructions (Addendum)
If you decide to sign up for part D Medicare plan then the shingles vaccine and Tdap vaccine will be covered for free underneath that plan at your local pharmacy.  We have updated your pneumonia vaccine today.  Get up-to-date labs today as well.  Please consider repeating colon cancer screening, Cologuard is an option.

## 2021-12-04 NOTE — Progress Notes (Signed)
Subjective:     Alison Wilson is a 71 y.o. female and is here for a comprehensive physical exam. The patient reports no problems.  Social History   Socioeconomic History   Marital status: Married    Spouse name: [name]   Number of children: 2   Years of education: Not on file   Highest education level: Not on file  Occupational History   Occupation: Retired  Tobacco Use   Smoking status: Every Day    Packs/day: 1.00    Types: Cigarettes   Smokeless tobacco: Never  Substance and Sexual Activity   Alcohol use: No   Drug use: No   Sexual activity: Not Currently  Other Topics Concern   Not on file  Social History Narrative   Exercise/walk 3 times a week. 4-5 cups of coffee a day    Social Determinants of Health   Financial Resource Strain: Not on file  Food Insecurity: Not on file  Transportation Needs: Not on file  Physical Activity: Not on file  Stress: Not on file  Social Connections: Not on file  Intimate Partner Violence: Not on file   Health Maintenance  Topic Date Due   Hepatitis C Screening  Never done   Zoster Vaccines- Shingrix (1 of 2) Never done   DEXA SCAN  Never done   MAMMOGRAM  05/28/2019   Fecal DNA (Cologuard)  05/27/2020   TETANUS/TDAP  06/18/2021   Pneumonia Vaccine 50+ Years old  Completed   INFLUENZA VACCINE  Completed   COVID-19 Vaccine  Completed   HPV VACCINES  Aged Out    The following portions of the patient's history were reviewed and updated as appropriate: allergies, current medications, past family history, past medical history, past social history, past surgical history, and problem list.  Review of Systems Pertinent items are noted in HPI.   Objective:    BP (!) 165/84 (BP Location: Left Arm)    Pulse 76    Resp 16    Ht 5\' 6"  (1.676 m)    Wt 127 lb (57.6 kg)    SpO2 98%    BMI 20.50 kg/m  General appearance: alert, cooperative, and appears stated age Head: Normocephalic, without obvious abnormality, atraumatic Eyes: conj  clear, EOMI, PEERLA Ears: normal TM's and external ear canals both ears Nose: Nares normal. Septum midline. Mucosa normal. No drainage or sinus tenderness. Throat: lips, mucosa, and tongue normal; teeth and gums normal Neck: no adenopathy, no carotid bruit, no JVD, supple, symmetrical, trachea midline, and thyroid not enlarged, symmetric, no tenderness/mass/nodules Back: symmetric, no curvature. ROM normal. No CVA tenderness. Lungs: clear to auscultation bilaterally Breasts: normal appearance, no masses or tenderness Heart: regular rate and rhythm, S1, S2 normal, no murmur, click, rub or gallop Abdomen: soft, non-tender; bowel sounds normal; no masses,  no organomegaly Extremities: extremities normal, atraumatic, no cyanosis or edema Pulses: 2+ and symmetric Skin: Skin color, texture, turgor normal. No rashes or lesions Lymph nodes: Cervical, supraclavicular, and axillary nodes normal. Neurologic: Grossly normal    Assessment:    Healthy female exam.      Plan:     See After Visit Summary for Counseling Recommendations  Keep up a regular exercise program and make sure you are eating a healthy diet Try to eat 4 servings of dairy a day, or if you are lactose intolerant take a calcium with vitamin D daily.  Your vaccines are up to date.   Repeat BP was high. Plan to prescirbe an ACE or  an ARB but I want to check her renal function and potassium first.  She is getting blood work drawn today so we should have it back tomorrow and it should be able to send in that prescription for her.

## 2021-12-05 LAB — COMPLETE METABOLIC PANEL WITH GFR
AG Ratio: 1.6 (calc) (ref 1.0–2.5)
ALT: 11 U/L (ref 6–29)
AST: 14 U/L (ref 10–35)
Albumin: 4.2 g/dL (ref 3.6–5.1)
Alkaline phosphatase (APISO): 65 U/L (ref 37–153)
BUN: 15 mg/dL (ref 7–25)
CO2: 28 mmol/L (ref 20–32)
Calcium: 10.4 mg/dL (ref 8.6–10.4)
Chloride: 107 mmol/L (ref 98–110)
Creat: 0.9 mg/dL (ref 0.60–1.00)
Globulin: 2.7 g/dL (calc) (ref 1.9–3.7)
Glucose, Bld: 104 mg/dL — ABNORMAL HIGH (ref 65–99)
Potassium: 4.6 mmol/L (ref 3.5–5.3)
Sodium: 139 mmol/L (ref 135–146)
Total Bilirubin: 0.5 mg/dL (ref 0.2–1.2)
Total Protein: 6.9 g/dL (ref 6.1–8.1)
eGFR: 68 mL/min/{1.73_m2} (ref >=60)

## 2021-12-05 LAB — LIPID PANEL W/REFLEX DIRECT LDL
Cholesterol: 246 mg/dL — ABNORMAL HIGH (ref <200)
HDL: 68 mg/dL (ref >=50)
LDL Cholesterol (Calc): 162 mg/dL (calc) — ABNORMAL HIGH
Non-HDL Cholesterol (Calc): 178 mg/dL (calc) — ABNORMAL HIGH (ref <130)
Total CHOL/HDL Ratio: 3.6 (calc) (ref <5.0)
Triglycerides: 67 mg/dL (ref <150)

## 2021-12-05 LAB — CBC
HCT: 51.1 % — ABNORMAL HIGH (ref 35.0–45.0)
Hemoglobin: 17.2 g/dL — ABNORMAL HIGH (ref 11.7–15.5)
MCH: 30.7 pg (ref 27.0–33.0)
MCHC: 33.7 g/dL (ref 32.0–36.0)
MCV: 91.1 fL (ref 80.0–100.0)
MPV: 9.5 fL (ref 7.5–12.5)
Platelets: 308 10*3/uL (ref 140–400)
RBC: 5.61 10*6/uL — ABNORMAL HIGH (ref 3.80–5.10)
RDW: 12.6 % (ref 11.0–15.0)
WBC: 9.7 10*3/uL (ref 3.8–10.8)

## 2021-12-05 NOTE — Progress Notes (Signed)
Hi Lakela, total cholesterol and LDL are high.  In fact your current 10-year risk for cardiovascular disease is 28% which is extremely high.  I would highly recommend starting a statin to lower your cholesterol to reduce your risk for heart attack and stroke.  If you are okay with starting medication please let me know and I can send a prescription to your pharmacy and then we can recheck everything in 3 months.  Your metabolic panel looks okay.  Hemoglobin is elevated this is usually secondary to smoking.  The 10-year ASCVD risk score (Arnett DK, et al., 2019) is: 28.1%   Values used to calculate the score:     Age: 71 years     Sex: Female     Is Non-Hispanic African American: No     Diabetic: No     Tobacco smoker: Yes     Systolic Blood Pressure: 354 mmHg     Is BP treated: No     HDL Cholesterol: 68 mg/dL     Total Cholesterol: 246 mg/dL

## 2021-12-06 MED ORDER — ATORVASTATIN CALCIUM 20 MG PO TABS: 20.0000 mg | ORAL_TABLET | Freq: Every day | ORAL | 3 refills | Status: DC

## 2021-12-06 MED ORDER — LOSARTAN POTASSIUM 25 MG PO TABS: 25.0000 mg | ORAL_TABLET | Freq: Every day | ORAL | 0 refills | Status: DC

## 2021-12-06 NOTE — Progress Notes (Signed)
Prescription sent to pharmacy.  We will need to check a metabolic panel in about 2 weeks after starting the medication.  And then we can plan to recheck her cholesterol in 3 months.  Orders Placed This Encounter     atorvastatin (LIPITOR) 20 MG tablet         Sig: Take 1 tablet (20 mg total) by mouth daily.         Dispense:  90 tablet         Refill:  3     losartan (COZAAR) 25 MG tablet         Sig: Take 1 tablet (25 mg total) by mouth daily.         Dispense:  90 tablet         Refill:  0

## 2021-12-06 NOTE — Addendum Note (Signed)
Addended by: Beatrice Lecher D on: 12/06/2021 01:32 PM   Modules accepted: Orders

## 2021-12-08 ENCOUNTER — Encounter: Payer: Self-pay | Admitting: Family Medicine

## 2021-12-09 MED ORDER — ZOSTER VAC RECOMB ADJUVANTED 50 MCG/0.5ML IM SUSR
0.5000 mL | Freq: Once | INTRAMUSCULAR | 1 refills | Status: AC
Start: 2021-12-09 — End: 2021-12-09

## 2021-12-09 NOTE — Telephone Encounter (Signed)
No orders of the defined types were placed in this encounter.  Meds ordered this encounter  Medications   Zoster Vaccine Adjuvanted [facility]) injection    Sig: Inject 0.5 mLs into the muscle once for 1 dose. Repeat dose in 2-6 monthns    Dispense:  0.5 mL    Refill:  1

## 2021-12-11 ENCOUNTER — Other Ambulatory Visit: Payer: Self-pay

## 2021-12-11 ENCOUNTER — Ambulatory Visit (INDEPENDENT_AMBULATORY_CARE_PROVIDER_SITE_OTHER): Payer: Medicare Other

## 2021-12-11 DIAGNOSIS — Z1231 Encounter for screening mammogram for malignant neoplasm of breast: Secondary | ICD-10-CM

## 2021-12-12 NOTE — Progress Notes (Signed)
Please call patient. Normal mammogram.  Repeat in 1 year.  

## 2021-12-25 ENCOUNTER — Other Ambulatory Visit: Payer: Medicare Other

## 2022-01-01 ENCOUNTER — Other Ambulatory Visit: Payer: Self-pay

## 2022-01-01 ENCOUNTER — Encounter: Payer: Self-pay | Admitting: Physician Assistant

## 2022-01-01 ENCOUNTER — Telehealth: Payer: Self-pay

## 2022-01-01 ENCOUNTER — Ambulatory Visit (INDEPENDENT_AMBULATORY_CARE_PROVIDER_SITE_OTHER): Payer: Medicare Other | Admitting: Physician Assistant

## 2022-01-01 VITALS — BP 133/81 | HR 81 | Temp 98.0°F | Resp 20 | Ht 66.0 in | Wt 125.1 lb

## 2022-01-01 DIAGNOSIS — J40 Bronchitis, not specified as acute or chronic: Secondary | ICD-10-CM

## 2022-01-01 DIAGNOSIS — J329 Chronic sinusitis, unspecified: Secondary | ICD-10-CM | POA: Diagnosis not present

## 2022-01-01 DIAGNOSIS — R32 Unspecified urinary incontinence: Secondary | ICD-10-CM | POA: Diagnosis not present

## 2022-01-01 DIAGNOSIS — R319 Hematuria, unspecified: Secondary | ICD-10-CM | POA: Diagnosis not present

## 2022-01-01 DIAGNOSIS — I10 Essential (primary) hypertension: Secondary | ICD-10-CM

## 2022-01-01 LAB — POCT URINALYSIS DIP (CLINITEK)
Bilirubin, UA: NEGATIVE
Glucose, UA: NEGATIVE mg/dL
Ketones, POC UA: NEGATIVE mg/dL
Leukocytes, UA: NEGATIVE
Nitrite, UA: NEGATIVE
POC PROTEIN,UA: NEGATIVE
Spec Grav, UA: 1.01 (ref 1.010–1.025)
Urobilinogen, UA: 0.2 E.U./dL
pH, UA: 7 (ref 5.0–8.0)

## 2022-01-01 MED ORDER — ALBUTEROL SULFATE HFA 108 (90 BASE) MCG/ACT IN AERS: 2.0000 | INHALATION_SPRAY | Freq: Four times a day (QID) | RESPIRATORY_TRACT | 0 refills | Status: DC | PRN

## 2022-01-01 MED ORDER — PREDNISONE 50 MG PO TABS: ORAL_TABLET | ORAL | 0 refills | Status: DC

## 2022-01-01 MED ORDER — AZITHROMYCIN 250 MG PO TABS
ORAL_TABLET | ORAL | 0 refills | Status: DC
Start: 2022-01-01 — End: 2022-01-06

## 2022-01-01 MED ORDER — SULFAMETHOXAZOLE-TRIMETHOPRIM 800-160 MG PO TABS: 1.0000 | ORAL_TABLET | Freq: Two times a day (BID) | ORAL | 0 refills | Status: DC

## 2022-01-01 NOTE — Patient Instructions (Signed)
Coricidin for coughs/cold.  ? ?Acute Bronchitis, Adult ?Acute bronchitis is sudden inflammation of the main airways (bronchi) that come off the windpipe (trachea) in the lungs. The swelling causes the airways to get smaller and make more mucus than normal. This can make it hard to breathe and can cause coughing or noisy breathing (wheezing). ?Acute bronchitis may last several weeks. The cough may last longer. Allergies, asthma, and exposure to smoke may make the condition worse. ?What are the causes? ?This condition can be caused by germs and by substances that irritate the lungs, including: ?Cold and flu viruses. The most common cause of this condition is the virus that causes the common cold. ?Bacteria. This is less common. ?Breathing in substances that irritate the lungs, including: ?Smoke from cigarettes and other forms of tobacco. ?Dust and pollen. ?Fumes from household cleaning products, gases, or burned fuel. ?Indoor or outdoor air pollution. ?What increases the risk? ?The following factors may make you more likely to develop this condition: ?A weak body's defense system, also called the immune system. ?A condition that affects your lungs and breathing, such as asthma. ?What are the signs or symptoms? ?Common symptoms of this condition include: ?Coughing. This may bring up clear, yellow, or green mucus from your lungs (sputum). ?Wheezing. ?Runny or stuffy nose. ?Having too much mucus in your lungs (chest congestion). ?Shortness of breath. ?Aches and pains, including sore throat or chest. ?How is this diagnosed? ?This condition is usually diagnosed based on: ?Your symptoms and medical history. ?A physical exam. ?You may also have other tests, including tests to rule out other conditions, such as pneumonia. These tests include: ?A test of lung function. ?Test of a mucus sample to look for the presence of bacteria. ?Tests to check the oxygen level in your blood. ?Blood tests. ?Chest X-ray. ?How is this  treated? ?Most cases of acute bronchitis clear up over time without treatment. Your health care provider may recommend: ?Drinking more fluids to help thin your mucus so it is easier to cough up. ?Taking inhaled medicine (inhaler) to improve air flow in and out of your lungs. ?Using a vaporizer or a humidifier. These are machines that add water to the air to help you breathe better. ?Taking a medicine that thins mucus and clears congestion (expectorant). ?Taking a medicine that prevents or stops coughing (cough suppressant). ?It is notcommon to take an antibiotic medicine for this condition. ?Follow these instructions at home: ? ?Take over-the-counter and prescription medicines only as told by your health care provider. ?Use an inhaler, vaporizer, or humidifier as told by your health care provider. ?Take two teaspoons (10 mL) of honey at bedtime to lessen coughing at night. ?Drink enough fluid to keep your urine pale yellow. ?Do not use any products that contain nicotine or tobacco. These products include cigarettes, chewing tobacco, and vaping devices, such as e-cigarettes. If you need help quitting, ask your health care provider. ?Get plenty of rest. ?Return to your normal activities as told by your health care provider. Ask your health care provider what activities are safe for you. ?Keep all follow-up visits. This is important. ?How is this prevented? ?To lower your risk of getting this condition again: ?Wash your hands often with soap and water for at least 20 seconds. If soap and water are not available, use hand sanitizer. ?Avoid contact with people who have cold symptoms. ?Try not to touch your mouth, nose, or eyes with your hands. ?Avoid breathing in smoke or chemical fumes. Breathing smoke or chemical  fumes will make your condition worse. ?Get the flu shot every year. ?Contact a health care provider if: ?Your symptoms do not improve after 2 weeks. ?You have trouble coughing up the mucus. ?Your cough keeps  you awake at night. ?You have a fever. ?Get help right away if you: ?Cough up blood. ?Feel pain in your chest. ?Have severe shortness of breath. ?Faint or keep feeling like you are going to faint. ?Have a severe headache. ?Have a fever or chills that get worse. ?These symptoms may represent a serious problem that is an emergency. Do not wait to see if the symptoms will go away. Get medical help right away. Call your local emergency services (911 in the [place] Do not drive yourself to the hospital. ?Summary ?Acute bronchitis is inflammation of the main airways (bronchi) that come off the windpipe (trachea) in the lungs. The swelling causes the airways to get smaller and make more mucus than normal. ?Drinking more fluids can help thin your mucus so it is easier to cough up. ?Take over-the-counter and prescription medicines only as told by your health care provider. ?Do not use any products that contain nicotine or tobacco. These products include cigarettes, chewing tobacco, and vaping devices, such as e-cigarettes. If you need help quitting, ask your health care provider. ?Contact a health care provider if your symptoms do not improve after 2 weeks. ?This information is not intended to replace advice given to you by your health care provider. Make sure you discuss any questions you have with your health care provider. ?Document Revised: 01/30/2021 Document Reviewed: 01/30/2021 ?Elsevier Patient Education ? [employer]. ? ?

## 2022-01-01 NOTE — Progress Notes (Addendum)
? ?Subjective:  ? ? Patient ID: Alison Wilson, female    DOB: [birth year], 71 y.o.   MRN: [id number] ? ?HPI ?Pt is a 71 yo female who is a smoker who presents to the clinic with cough, congestion, sinus pressure, fatigue, urinary discomfort, urinary frequency and leakage for the last 2 weeks. She is not taking anything OTC right now. No fever, chills, SOB. Her chest is tight. She is coughing and has some slight urinary leakage and there is some discomfort with urination.  ? ?Active Ambulatory Problems  ?  Diagnosis Date Noted  ? Tobacco abuse 06/19/2011  ? Arthritis 06/19/2011  ? Hyperlipidemia 07/07/2011  ? Urinary incontinence 01/01/2022  ? ?Resolved Ambulatory Problems  ?  Diagnosis Date Noted  ? Swelling of joint, knee, left 10/04/2021  ? Annual physical exam 11/01/2021  ? ?Past Medical History:  ?Diagnosis Date  ? Fibrocystic breast   ? Herpes 10/13/1992  ? Osteoarthritis   ? ? ? ?Review of Systems ?See HPI.  ?   ?Objective:  ? Physical Exam ?Vitals reviewed.  ?Constitutional:   ?   Appearance: Normal appearance.  ?HENT:  ?   Head: Normocephalic.  ?   Right Ear: Tympanic membrane normal.  ?   Left Ear: Tympanic membrane normal.  ?   Nose: Nose normal.  ?   Mouth/Throat:  ?   Mouth: Mucous membranes are moist.  ?Eyes:  ?   Extraocular Movements: Extraocular movements intact.  ?   Conjunctiva/sclera: Conjunctivae normal.  ?   Pupils: Pupils are equal, round, and reactive to light.  ?Cardiovascular:  ?   Rate and Rhythm: Normal rate and regular rhythm.  ?   Pulses: Normal pulses.  ?   Heart sounds: Normal heart sounds.  ?Pulmonary:  ?   Effort: Pulmonary effort is normal.  ?   Breath sounds: Rhonchi present. No wheezing.  ?Abdominal:  ?   General: There is no distension.  ?   Palpations: Abdomen is soft. There is no mass.  ?   Tenderness: There is no abdominal tenderness. There is no guarding or rebound.  ?   Hernia: No hernia is present.  ?Musculoskeletal:  ?   Cervical back: No tenderness.  ?   Right lower leg: No  edema.  ?   Left lower leg: No edema.  ?Lymphadenopathy:  ?   Cervical: No cervical adenopathy.  ?Neurological:  ?   General: No focal deficit present.  ?   Mental Status: She is alert and oriented to person, place, and time.  ?Psychiatric:     ?   Mood and Affect: Mood normal.  ?.. ?Results for orders placed or performed in visit on 01/01/22  ?POCT URINALYSIS DIP (CLINITEK)  ?Result Value Ref Range  ? Color, UA yellow yellow  ? Clarity, UA clear clear  ? Glucose, UA negative negative mg/dL  ? Bilirubin, UA negative negative  ? Ketones, POC UA negative negative mg/dL  ? Spec Grav, UA 1.010 1.010 - 1.025  ? Blood, UA moderate (A) negative  ? pH, UA 7.0 5.0 - 8.0  ? POC PROTEIN,UA negative negative, trace  ? Urobilinogen, UA 0.2 0.2 or 1.0 E.U./dL  ? Nitrite, UA Negative Negative  ? Leukocytes, UA Negative Negative  ? ? ?   ?Assessment & Plan:  ?..Alison Wilson was seen today for urinary tract infection, cough and chest congestion. ? ?Diagnoses and all orders for this visit: ? ?Sinobronchitis ?-     predniSONE (DELTASONE) 50 MG tablet; One tab  PO daily for 5 days. ?-     sulfamethoxazole-trimethoprim (BACTRIM DS) 800-160 MG tablet; Take 1 tablet by mouth 2 (two) times daily. For 7 days. ?-     albuterol (VENTOLIN HFA) 108 (90 Base) MCG/ACT inhaler; Inhale 2 puffs into the lungs every 6 (six) hours as needed. ? ?Urinary incontinence, unspecified type ?-     Urine Culture ? ?Hematuria, unspecified type ?-     POCT URINALYSIS DIP (CLINITEK) ?-     Urine Culture ? ? ?Treating with Bactrim for sinobronchitis and possible UTI.  ?UA positive for blood.  ?Will culture. ?Stay hydrated. ?Added prednisone and albuterol for the bronchitis ?Discussed with pt follow up UA to make sure hematuria has resolved after feeling better ?Follow up as needed or if symptoms worsen.  ? ?

## 2022-01-01 NOTE — Telephone Encounter (Signed)
Patient advised.

## 2022-01-01 NOTE — Telephone Encounter (Signed)
Alison Wilson called and left a message stating the Bactrim is too large for her to swallow. She is also not going to take it because of all the side effects. She wants an antibiotic she has been prescribed in the past.  ?

## 2022-01-01 NOTE — Telephone Encounter (Signed)
Sent zpak.

## 2022-01-03 LAB — URINE CULTURE
MICRO NUMBER:: 13168770
SPECIMEN QUALITY:: ADEQUATE

## 2022-01-03 NOTE — Progress Notes (Signed)
No significant bacteria in urine detected.

## 2022-01-06 ENCOUNTER — Encounter: Payer: Self-pay | Admitting: Family Medicine

## 2022-01-06 ENCOUNTER — Other Ambulatory Visit: Payer: Self-pay

## 2022-01-06 ENCOUNTER — Ambulatory Visit (INDEPENDENT_AMBULATORY_CARE_PROVIDER_SITE_OTHER): Payer: Medicare Other | Admitting: Family Medicine

## 2022-01-06 VITALS — BP 141/78 | HR 80 | Resp 18 | Ht 66.0 in | Wt 125.0 lb

## 2022-01-06 DIAGNOSIS — R140 Abdominal distension (gaseous): Secondary | ICD-10-CM

## 2022-01-06 DIAGNOSIS — N760 Acute vaginitis: Secondary | ICD-10-CM

## 2022-01-06 DIAGNOSIS — R109 Unspecified abdominal pain: Secondary | ICD-10-CM | POA: Diagnosis not present

## 2022-01-06 DIAGNOSIS — J40 Bronchitis, not specified as acute or chronic: Secondary | ICD-10-CM

## 2022-01-06 DIAGNOSIS — I10 Essential (primary) hypertension: Secondary | ICD-10-CM

## 2022-01-06 DIAGNOSIS — R319 Hematuria, unspecified: Secondary | ICD-10-CM | POA: Diagnosis not present

## 2022-01-06 DIAGNOSIS — R300 Dysuria: Secondary | ICD-10-CM

## 2022-01-06 LAB — POCT URINALYSIS DIP (CLINITEK)
Bilirubin, UA: NEGATIVE
Glucose, UA: NEGATIVE mg/dL
Ketones, POC UA: NEGATIVE mg/dL
Nitrite, UA: NEGATIVE
POC PROTEIN,UA: NEGATIVE
Spec Grav, UA: 1.015 (ref 1.010–1.025)
Urobilinogen, UA: 0.2 E.U./dL
pH, UA: 6.5 (ref 5.0–8.0)

## 2022-01-06 LAB — WET PREP FOR TRICH, YEAST, CLUE
MICRO NUMBER:: 13183343
Specimen Quality: ADEQUATE

## 2022-01-06 MED ORDER — LOSARTAN POTASSIUM 25 MG PO TABS: 25.0000 mg | ORAL_TABLET | Freq: Every day | ORAL | 1 refills | Status: DC

## 2022-01-06 NOTE — Progress Notes (Signed)
? ?Acute Office Visit ? ?Subjective:  ? ? Patient ID: Alison Wilson, female    DOB: 19-Oct-1950, 71 y.o.   MRN: [id number] ? ?Chief Complaint  ?Patient presents with  ? Urinary Tract Infection  ?  Patient complains of urinary pressure.   ? ? ?HPI ?Patient is in today for continued urinary symptoms.  She saw one of my partners on March 22 so about 5 days ago after developing some upper respiratory type symptoms that she got from her husband had been sick a week prior to when she became ill.  She was treated with azithromycin.  She did complete the antibiotic yesterday.  She does feel like she is about 75% better in regards to her upper respiratory She still feels like overall her congestion is better but still having a little bit at night. ? ?She is also been having some lower abdominal pressure.  No pain or burning.  No gross hematuria.  She does feel bloated but denies any constipation she has not noticed any abnormal vaginal discharge or rash.  But says that she noticed that when she wiped that it felt a little swollen at the vaginal opening.  Sounds like she is also getting a little bit of urgency. ? ?Prior history of hysterectomy but she does still have both ovaries.  Prior history of a cyst on one of her ovaries. ? ?Past Medical History:  ?Diagnosis Date  ? Fibrocystic breast   ? Herpes 10/13/1992  ? Osteoarthritis   ? ? ?Past Surgical History:  ?Procedure Laterality Date  ? ABDOMINAL HYSTERECTOMY  01/12/1995  ? fibroid, still has her ovaries.   ? BREAST EXCISIONAL BIOPSY Right 1989  ? Cyst on left ovary removed  10/13/1994  ? TONSILLECTOMY AND ADENOIDECTOMY  10/14/1955  ? ? ?History reviewed. No pertinent family history. ? ?Social History  ? ?Socioeconomic History  ? Marital status: Married  ?  Spouse name: Oletta Lamas  ? Number of children: 2  ? Years of education: Not on file  ? Highest education level: Not on file  ?Occupational History  ? Occupation: Retired  ?Tobacco Use  ? Smoking status: Every Day  ?   Packs/day: 1.00  ?  Types: Cigarettes  ? Smokeless tobacco: Never  ? Tobacco comments:  ?  1 pack a day   ?Substance and Sexual Activity  ? Alcohol use: No  ? Drug use: No  ? Sexual activity: Not Currently  ?Other Topics Concern  ? Not on file  ?Social History Narrative  ? Exercise/walk 3 times a week. 4-5 cups of coffee a day   ? ?Social Determinants of Health  ? ?Financial Resource Strain: Not on file  ?Food Insecurity: Not on file  ?Transportation Needs: Not on file  ?Physical Activity: Not on file  ?Stress: Not on file  ?Social Connections: Not on file  ?Intimate Partner Violence: Not on file  ? ? ?Outpatient Medications Prior to Visit  ?Medication Sig Dispense Refill  ? atorvastatin (LIPITOR) 20 MG tablet Take 1 tablet (20 mg total) by mouth daily. 90 tablet 3  ? losartan (COZAAR) 25 MG tablet Take 1 tablet (25 mg total) by mouth daily. 90 tablet 0  ? albuterol (VENTOLIN HFA) 108 (90 Base) MCG/ACT inhaler Inhale 2 puffs into the lungs every 6 (six) hours as needed. 18 g 0  ? azithromycin (ZITHROMAX) 250 MG tablet 2 Ttabs PO on Day 1, then one a day x 4 days. 6 tablet 0  ? predniSONE (DELTASONE) 50 MG tablet  One tab PO daily for 5 days. 5 tablet 0  ? sulfamethoxazole-trimethoprim (BACTRIM DS) 800-160 MG tablet Take 1 tablet by mouth 2 (two) times daily. For 7 days. 14 tablet 0  ? ?No facility-administered medications prior to visit.  ? ? ?Allergies  ?Allergen Reactions  ? Dexilant [Dexlansoprazole] Other (See Comments)  ?  Pt says felt more SOB on med but was also being tx'd for bronchitis at the time.   ? Cephalexin Rash  ? ? ?Review of Systems ? ?   ?Objective:  ?  ?Physical Exam ?Vitals and nursing note reviewed.  ?Constitutional:   ?   Appearance: She is well-developed.  ?HENT:  ?   Head: Normocephalic and atraumatic.  ?Cardiovascular:  ?   Rate and Rhythm: Normal rate and regular rhythm.  ?   Heart sounds: Normal heart sounds.  ?Pulmonary:  ?   Effort: Pulmonary effort is normal.  ?   Breath sounds: Normal  breath sounds.  ?Skin: ?   General: Skin is warm and dry.  ?Neurological:  ?   Mental Status: She is alert and oriented to person, place, and time.  ?Psychiatric:     ?   Behavior: Behavior normal.  ? ? ?BP (!) 141/78   Pulse 80   Resp 18   Ht $R'5\' 6"'zJ$  (1.676 m)   Wt 125 lb (56.7 kg)   SpO2 98%   BMI 20.18 kg/m?  ?Wt Readings from Last 3 Encounters:  ?01/06/22 125 lb (56.7 kg)  ?01/01/22 125 lb 1.3 oz (56.7 kg)  ?12/04/21 127 lb (57.6 kg)  ? ? ?Health Maintenance Due  ?Topic Date Due  ? Hepatitis C Screening  Never done  ? DEXA SCAN  Never done  ? ? ?There are no preventive care reminders to display for this patient. ? ? ?No results found for: TSH ?Lab Results  ?Component Value Date  ? WBC 9.7 12/04/2021  ? HGB 17.2 (H) 12/04/2021  ? HCT 51.1 (H) 12/04/2021  ? MCV 91.1 12/04/2021  ? PLT 308 12/04/2021  ? ?Lab Results  ?Component Value Date  ? NA 139 12/04/2021  ? K 4.6 12/04/2021  ? CO2 28 12/04/2021  ? GLUCOSE 104 (H) 12/04/2021  ? BUN 15 12/04/2021  ? CREATININE 0.90 12/04/2021  ? BILITOT 0.5 12/04/2021  ? ALKPHOS 60 03/08/2014  ? AST 14 12/04/2021  ? ALT 11 12/04/2021  ? PROT 6.9 12/04/2021  ? ALBUMIN 4.6 03/08/2014  ? CALCIUM 10.4 12/04/2021  ? EGFR 68 12/04/2021  ? ?Lab Results  ?Component Value Date  ? CHOL 246 (H) 12/04/2021  ? ?Lab Results  ?Component Value Date  ? HDL 68 12/04/2021  ? ?Lab Results  ?Component Value Date  ? LDLCALC 162 (H) 12/04/2021  ? ?Lab Results  ?Component Value Date  ? TRIG 67 12/04/2021  ? ?Lab Results  ?Component Value Date  ? CHOLHDL 3.6 12/04/2021  ? ?Lab Results  ?Component Value Date  ? HGBA1C 5.6 10/28/2012  ? ? ?   ?Assessment & Plan:  ? ?Problem List Items Addressed This Visit   ?None ?Visit Diagnoses   ? ? Dysuria    -  Primary  ? Relevant Orders  ? Urine Culture  ? POCT URINALYSIS DIP (CLINITEK) (Completed)  ? Urinalysis, microscopic only  ? Primary hypertension      ? Relevant Medications  ? losartan (COZAAR) 25 MG tablet  ? Acute vaginitis      ? Relevant Orders  ? WET  PREP FOR [place],  YEAST, CLUE  ? Bloating      ? Hematuria, unspecified type      ? Abdominal pressure      ? Bronchitis      ? ?  ? ? ?Bloating - she is not constipated. We can consider pelvic U/S for further workup.   ? ?Vaginal swelling-consider different causes.  She just finished antibiotics it could be a potential yeast infection.  No discrete rash.  It may be a little bit of prolapse though I was unable to visualize the prolapse on exam today. ? ?Urethral atrophy-could be creating a little bit of the irritative symptoms that she is having but should not really be causing lower abdominal pressure. ? ?Bronchitis-she does feel like she is about 75% better but still having a little bit of mucus and congestion in her chest area.  Lungs are clear on exam so just gave her reassurance that it may take a few more days for her to continue to feel better but I do not see any indication for additional antibiotics at this point in time.  I suspect by the end of the week she will feel significantly better. ? ?Hematuria-sent for urine microscopic review to see how many whole red blood cells are present as this may be a separate issue and needs to potentially be worked up further. ? ?Meds ordered this encounter  ?Medications  ? losartan (COZAAR) 25 MG tablet  ?  Sig: Take 1 tablet (25 mg total) by mouth daily.  ?  Dispense:  90 tablet  ?  Refill:  1  ? ? ? ?Beatrice Lecher, MD ? ?

## 2022-01-06 NOTE — Patient Instructions (Addendum)
You can try Azo.  It is found over-the-counter and can be helpful with urinary tract symptoms. ? ? ?

## 2022-01-07 ENCOUNTER — Encounter: Payer: Self-pay | Admitting: Family Medicine

## 2022-01-07 DIAGNOSIS — R102 Pelvic and perineal pain: Secondary | ICD-10-CM

## 2022-01-07 DIAGNOSIS — R109 Unspecified abdominal pain: Secondary | ICD-10-CM

## 2022-01-07 LAB — URINALYSIS, MICROSCOPIC ONLY
Bacteria, UA: NONE SEEN /HPF
Hyaline Cast: NONE SEEN /LPF
RBC / HPF: NONE SEEN /HPF (ref 0–2)
Squamous Epithelial / HPF: NONE SEEN /HPF (ref <=5)
WBC, UA: NONE SEEN /HPF (ref 0–5)

## 2022-01-07 NOTE — Progress Notes (Signed)
Hi Alison Wilson, just wanted to let you know that the swab did not show any excess bacteria or yeast in the vaginal area which is great.  Also the microscopic review was negative for any whole red blood cells which is great.  So you do not need any additional work-up for blood in the urine at this point.  You are still awaiting the urine culture results.

## 2022-01-08 ENCOUNTER — Encounter: Payer: Self-pay | Admitting: Family Medicine

## 2022-01-08 LAB — URINE CULTURE
MICRO NUMBER:: 13187646
Result:: NO GROWTH
SPECIMEN QUALITY:: ADEQUATE

## 2022-01-08 NOTE — Telephone Encounter (Signed)
LVM for patient to call back to get an appt scheduled. AM ?

## 2022-01-08 NOTE — Telephone Encounter (Signed)
Orders Placed This Encounter  ?Procedures  ? US Pelvic Complete With Transvaginal  ?  Standing Status:   Future  ?  Standing Expiration Date:   01/09/2023  ?  Order Specific Question:   Reason for Exam (SYMPTOM  OR DIAGNOSIS REQUIRED)  ?  Answer:   pelvic pain  ?  Order Specific Question:   Preferred imaging location?  ?  Answer:   [facility]  ? ? ?

## 2022-01-08 NOTE — Progress Notes (Signed)
Hi Alison Wilson, the urine culture is negative so no sign of UTI.  The other test showed no worrisome amounts of blood in the urine.  So the next step would be to do a pelvic ultrasound for further work-up if you are okay with that please let us know and we can get that ordered.

## 2022-01-08 NOTE — Telephone Encounter (Signed)
Please contact the patient for appointment scheduling. Thanks in advance.  ?

## 2022-01-10 ENCOUNTER — Ambulatory Visit (INDEPENDENT_AMBULATORY_CARE_PROVIDER_SITE_OTHER): Payer: Medicare Other

## 2022-01-10 DIAGNOSIS — R102 Pelvic and perineal pain: Secondary | ICD-10-CM

## 2022-01-10 DIAGNOSIS — R109 Unspecified abdominal pain: Secondary | ICD-10-CM | POA: Diagnosis not present

## 2022-01-10 NOTE — Progress Notes (Signed)
Hi Adoria,  ?The pelvic US shows a small 7 mm cyst on the right ovary, they felt like it looked normal. Because it is smaller than a 1 cm they didn't recommend any further work up.  ?They didn't seen any extra fluid etc.  I am happy to refer you to Urology if you are still having discomfort with urination.

## 2022-01-12 ENCOUNTER — Encounter: Payer: Self-pay | Admitting: Family Medicine

## 2022-01-13 ENCOUNTER — Other Ambulatory Visit: Payer: Medicare Other

## 2022-06-04 ENCOUNTER — Encounter: Payer: Self-pay | Admitting: General Practice

## 2022-08-17 ENCOUNTER — Encounter: Payer: Self-pay | Admitting: Family Medicine

## 2022-08-17 DIAGNOSIS — E785 Hyperlipidemia, unspecified: Secondary | ICD-10-CM

## 2022-08-17 DIAGNOSIS — I10 Essential (primary) hypertension: Secondary | ICD-10-CM

## 2022-08-18 MED ORDER — LOSARTAN POTASSIUM 25 MG PO TABS: 25.0000 mg | ORAL_TABLET | Freq: Every day | ORAL | 0 refills | Status: DC

## 2022-08-18 MED ORDER — ATORVASTATIN CALCIUM 20 MG PO TABS: 20.0000 mg | ORAL_TABLET | Freq: Every day | ORAL | 0 refills | Status: DC

## 2022-09-01 ENCOUNTER — Telehealth: Payer: Self-pay | Admitting: General Practice

## 2022-09-01 NOTE — Telephone Encounter (Signed)
Left message for patient to schedule medicare wellness visit.  

## 2022-09-10 DIAGNOSIS — I10 Essential (primary) hypertension: Secondary | ICD-10-CM | POA: Insufficient documentation

## 2022-09-10 NOTE — Progress Notes (Unsigned)
   Established Patient Office Visit  Subjective   Patient ID: Alison Wilson, female    DOB: 06/10/1951  Age: 71 y.o. MRN: [id number]  No chief complaint on file.   HPI  {History (Optional):23778}  ROS    Objective:     There were no vitals taken for this visit. {Vitals History (Optional):23777}  Physical Exam   No results found for any visits on 09/11/22.  {Labs (Optional):23779}  The 10-year ASCVD risk score (Arnett DK, et al., 2019) is: 30.3%    Assessment & Plan:   Problem List Items Addressed This Visit   None   No follow-ups on file.    Beatrice Lecher, MD

## 2022-09-11 ENCOUNTER — Encounter: Payer: Self-pay | Admitting: Family Medicine

## 2022-09-11 ENCOUNTER — Ambulatory Visit (INDEPENDENT_AMBULATORY_CARE_PROVIDER_SITE_OTHER): Payer: Medicare Other | Admitting: Family Medicine

## 2022-09-11 VITALS — BP 152/78 | HR 77 | Ht 66.0 in | Wt 123.0 lb

## 2022-09-11 DIAGNOSIS — I10 Essential (primary) hypertension: Secondary | ICD-10-CM | POA: Diagnosis not present

## 2022-09-11 DIAGNOSIS — Z720 Tobacco use: Secondary | ICD-10-CM

## 2022-09-11 DIAGNOSIS — Z23 Encounter for immunization: Secondary | ICD-10-CM | POA: Diagnosis not present

## 2022-09-11 DIAGNOSIS — E785 Hyperlipidemia, unspecified: Secondary | ICD-10-CM

## 2022-09-11 MED ORDER — LOSARTAN POTASSIUM 50 MG PO TABS: 50.0000 mg | ORAL_TABLET | Freq: Every day | ORAL | 1 refills | Status: DC

## 2022-09-11 MED ORDER — VARENICLINE TARTRATE 1 MG PO TABS: ORAL_TABLET | ORAL | 1 refills | Status: DC

## 2022-09-11 NOTE — Assessment & Plan Note (Signed)
Discussed options.  She is interested in Chantix.  Discussed how the medication works and when to set her quit date.  Do think she will be successful especially if she and her husband are quitting at the same time.  Did warn about potential side effects including nausea.

## 2022-09-11 NOTE — Assessment & Plan Note (Signed)
Repeat blood pressure does look better today but it still quite elevated.  We will bump up losartan to 50 mg.  Quitting smoking will probably help with blood pressure as well.  Due for updated lab work today.

## 2022-09-11 NOTE — Patient Instructions (Signed)
We would also like to get you scheduled for a Medicare wellness exam.  This can be done with her Medicare wellness nurse over the phone.  Please schedule at your convenience.  Care includes this as part of your benefits yearly and it is free.

## 2022-09-12 LAB — BASIC METABOLIC PANEL WITH GFR
BUN: 14 mg/dL (ref 7–25)
CO2: 24 mmol/L (ref 20–32)
Calcium: 10.2 mg/dL (ref 8.6–10.4)
Chloride: 105 mmol/L (ref 98–110)
Creat: 0.75 mg/dL (ref 0.60–1.00)
Glucose, Bld: 104 mg/dL (ref 65–139)
Potassium: 4.3 mmol/L (ref 3.5–5.3)
Sodium: 137 mmol/L (ref 135–146)
eGFR: 84 mL/min/{1.73_m2} (ref >=60)

## 2022-09-12 LAB — LIPID PANEL W/REFLEX DIRECT LDL
Cholesterol: 172 mg/dL (ref <200)
HDL: 65 mg/dL (ref >=50)
LDL Cholesterol (Calc): 93 mg/dL (calc)
Non-HDL Cholesterol (Calc): 107 mg/dL (calc) (ref <130)
Total CHOL/HDL Ratio: 2.6 (calc) (ref <5.0)
Triglycerides: 57 mg/dL (ref <150)

## 2022-09-12 NOTE — Progress Notes (Signed)
HI Lucine, your metabolic panel looks perfect.  Cholesterol looks absolutely fantastic!  Everything looks wonderful.  Would encourage you to schedule Medicare wellness exam with her Medicare nurse.  I hope you have a great holiday.

## 2022-10-11 DIAGNOSIS — Z888 Allergy status to other drugs, medicaments and biological substances status: Secondary | ICD-10-CM | POA: Diagnosis not present

## 2022-10-11 DIAGNOSIS — Z881 Allergy status to other antibiotic agents status: Secondary | ICD-10-CM | POA: Diagnosis not present

## 2022-10-11 DIAGNOSIS — R0602 Shortness of breath: Secondary | ICD-10-CM | POA: Diagnosis not present

## 2022-10-11 DIAGNOSIS — B349 Viral infection, unspecified: Secondary | ICD-10-CM | POA: Diagnosis not present

## 2022-10-11 DIAGNOSIS — R0682 Tachypnea, not elsewhere classified: Secondary | ICD-10-CM | POA: Diagnosis not present

## 2022-10-11 DIAGNOSIS — Z87891 Personal history of nicotine dependence: Secondary | ICD-10-CM | POA: Diagnosis not present

## 2022-10-17 ENCOUNTER — Encounter: Payer: Self-pay | Admitting: Family Medicine

## 2022-10-17 ENCOUNTER — Ambulatory Visit (INDEPENDENT_AMBULATORY_CARE_PROVIDER_SITE_OTHER): Payer: Medicare Other | Admitting: Family Medicine

## 2022-10-17 VITALS — BP 134/80 | HR 81 | Ht 66.0 in | Wt 123.0 lb

## 2022-10-17 DIAGNOSIS — D582 Other hemoglobinopathies: Secondary | ICD-10-CM | POA: Diagnosis not present

## 2022-10-17 DIAGNOSIS — J210 Acute bronchiolitis due to respiratory syncytial virus: Secondary | ICD-10-CM

## 2022-10-17 DIAGNOSIS — R9431 Abnormal electrocardiogram [ECG] [EKG]: Secondary | ICD-10-CM

## 2022-10-17 MED ORDER — ALBUTEROL SULFATE HFA 108 (90 BASE) MCG/ACT IN AERS: 2.0000 | INHALATION_SPRAY | Freq: Four times a day (QID) | RESPIRATORY_TRACT | 0 refills | Status: DC | PRN

## 2022-10-17 NOTE — Progress Notes (Signed)
Established Patient Office Visit  Subjective   Patient ID: Alison Wilson, female    DOB: [birth year]  Age: 72 y.o. MRN: [id number]  Chief Complaint  Patient presents with   Follow-up    HPI  She was seen at [facility] on 12/30 for SOB and dx with RSV. CXR was normal.  Elevated hemoglobin. Still occ SOB with activity.  Taking Mucinex D.  She has been sick for a total of about 13 days at this point.  She has felt a little better in the last 3 days but is just really struggled.  She felt a little bit clammy last night.  She did feel little short of breath today when she was moving some boxes.  Sleep has been good the cough has not been keeping her awake.  She was also noted to have an abnormal EKG while she was there so wanted to follow that up as well as some abnormal blood work.    ROS    Objective:     BP 134/80   Pulse 81   Ht '5\' 6"'$  (1.676 m)   Wt 123 lb (55.8 kg)   SpO2 95%   BMI 19.85 kg/m    Physical Exam Vitals and nursing note reviewed.  Constitutional:      Appearance: She is well-developed.  HENT:     Head: Normocephalic and atraumatic.     Right Ear: Tympanic membrane, ear canal and external ear normal.     Left Ear: Tympanic membrane, ear canal and external ear normal.     Nose: Nose normal.  Eyes:     Conjunctiva/sclera: Conjunctivae normal.     Pupils: Pupils are equal, round, and reactive to light.  Neck:     Thyroid: No thyromegaly.  Cardiovascular:     Rate and Rhythm: Normal rate and regular rhythm.     Heart sounds: Normal heart sounds.  Pulmonary:     Effort: Pulmonary effort is normal.     Breath sounds: Normal breath sounds. No wheezing.  Musculoskeletal:     Cervical back: Neck supple.  Lymphadenopathy:     Cervical: No cervical adenopathy.  Skin:    General: Skin is warm and dry.  Neurological:     Mental Status: She is alert and oriented to person, place, and time.  Psychiatric:        Behavior: Behavior normal.      No  results found for any visits on 10/17/22.    The 10-year ASCVD risk score (Arnett DK, et al., 2019) is: 26.4%    Assessment & Plan:   Problem List Items Addressed This Visit   None Visit Diagnoses     RSV (acute bronchiolitis due to respiratory syncytial virus)    -  Primary   Relevant Medications   albuterol (VENTOLIN HFA) 108 (90 Base) MCG/ACT inhaler   Abnormal EKG       Relevant Orders   ECHOCARDIOGRAM COMPLETE   EKG 12-Lead   Elevated hemoglobin (HCC)       Relevant Orders   CBC with Differential/Platelet       RSV-did send over an inhaler.  She is a longtime smoker so I suspect there could be some underlying obstructive pulmonary disease so I think it may be worth a try an inhaler to see if that helps.  Call if not improving over the weekend.  Continue with rest hydration okay to continue with Mucinex D.  Abnormal EKG-I just have the interpretation available which indicates  some PVCs as well as biatrial enlargement.  Plan to repeat EKG today.  EKG shows rate of 76 bpm, normal sinus rhythm with elevated P waves indicating possible left atrial enlargement.  Plan for echocardiogram for further evaluation.  I do not have an old EKG on file and no prior cardiac workup.  Evaded hemoglobin-plan to recheck labs in about 2 weeks when she is feeling better and is also better hydrated.  Return in about 6 weeks (around 11/28/2022) for spirometry .    Beatrice Lecher, MD

## 2022-10-17 NOTE — Patient Instructions (Addendum)
Due to puffs on the albuterol.  Make sure to rest and take a couple breaths in between the puffs so you can get a good inhalation.  Albuterol will typically make you a little bit jittery but that should only last for an hour or 2 and then it should wear off.  If you feel little jittery that means you probably did a good job in getting the medicine in your lungs.  Okay to continue the Mucinex D.  Make sure you are staying well-hydrated and increasing your activity level as you are feeling a little better.  Will work on getting an echocardiogram scheduled for you.  Also like to schedule you for a breathing test in about 6 weeks to give your lungs a chance to recover and then we can better evaluate your baseline lung function.

## 2022-10-18 ENCOUNTER — Encounter: Payer: Self-pay | Admitting: Family Medicine

## 2022-10-21 ENCOUNTER — Encounter: Payer: Self-pay | Admitting: Family Medicine

## 2022-11-06 ENCOUNTER — Ambulatory Visit: Payer: Medicare Other | Admitting: Family Medicine

## 2022-11-07 ENCOUNTER — Other Ambulatory Visit: Payer: Medicare Other

## 2022-11-07 DIAGNOSIS — D582 Other hemoglobinopathies: Secondary | ICD-10-CM | POA: Diagnosis not present

## 2022-11-07 LAB — CBC WITH DIFFERENTIAL/PLATELET
Absolute Monocytes: 1122 cells/uL — ABNORMAL HIGH (ref 200–950)
Basophils Absolute: 61 cells/uL (ref 0–200)
Basophils Relative: 0.7 %
Eosinophils Absolute: 87 cells/uL (ref 15–500)
Eosinophils Relative: 1 %
HCT: 47.7 % — ABNORMAL HIGH (ref 35.0–45.0)
Hemoglobin: 16.3 g/dL — ABNORMAL HIGH (ref 11.7–15.5)
Lymphs Abs: 2436 cells/uL (ref 850–3900)
MCH: 30.4 pg (ref 27.0–33.0)
MCHC: 34.2 g/dL (ref 32.0–36.0)
MCV: 89 fL (ref 80.0–100.0)
MPV: 9.2 fL (ref 7.5–12.5)
Monocytes Relative: 12.9 %
Neutro Abs: 4994 cells/uL (ref 1500–7800)
Neutrophils Relative %: 57.4 %
Platelets: 290 10*3/uL (ref 140–400)
RBC: 5.36 10*6/uL — ABNORMAL HIGH (ref 3.80–5.10)
RDW: 12.1 % (ref 11.0–15.0)
Total Lymphocyte: 28 %
WBC: 8.7 10*3/uL (ref 3.8–10.8)

## 2022-11-10 NOTE — Progress Notes (Signed)
HI Alison Wilson, hemoglobin still high but better.

## 2022-11-13 ENCOUNTER — Encounter: Payer: Self-pay | Admitting: Family Medicine

## 2022-11-13 ENCOUNTER — Ambulatory Visit (INDEPENDENT_AMBULATORY_CARE_PROVIDER_SITE_OTHER): Payer: Medicare Other | Admitting: Family Medicine

## 2022-11-13 VITALS — BP 137/78 | HR 70

## 2022-11-13 DIAGNOSIS — J019 Acute sinusitis, unspecified: Secondary | ICD-10-CM

## 2022-11-13 DIAGNOSIS — R7309 Other abnormal glucose: Secondary | ICD-10-CM | POA: Diagnosis not present

## 2022-11-13 DIAGNOSIS — E785 Hyperlipidemia, unspecified: Secondary | ICD-10-CM | POA: Diagnosis not present

## 2022-11-13 DIAGNOSIS — D582 Other hemoglobinopathies: Secondary | ICD-10-CM | POA: Diagnosis not present

## 2022-11-13 DIAGNOSIS — R42 Dizziness and giddiness: Secondary | ICD-10-CM

## 2022-11-13 MED ORDER — DOXYCYCLINE HYCLATE 100 MG PO TABS: 100.0000 mg | ORAL_TABLET | Freq: Two times a day (BID) | ORAL | 0 refills | Status: DC

## 2022-11-13 NOTE — Progress Notes (Signed)
Acute Office Visit  Subjective:     Patient ID: Alison Wilson, female    DOB: 1951/03/09, 72 y.o.   MRN: [id number]  Chief Complaint  Patient presents with   Chills         HPI Patient is in today for chills, lightheadedness, and nausea. Started yesterday and was out and given a cookie and then felt better. Happened again last night and had a piece a toast and felt better.  The lightheadedness to headedness as feeling like her balance is off.  It just last for about 10 seconds and then resolves after sitting down or eating.  She has been eating only once a day because she thought maybe it was impacting her red blood cells.  Still feeling some nasal congestion. Taking Mucinex.  He says the nasal congestion has been there since she was diagnosed with RSV 4 weeks ago but its gotten worse though it seems to be more intermittent.  She feels like the glands in her neck are more swollen and tender again and she is getting some ear discomfort but not pain.  He also reports that her hands have been getting unusually cold intermittently.  Because she does not feel well she like to push out her echocardiogram which is currently scheduled for tomorrow.  ROS      Objective:    BP 137/78   Pulse 70   SpO2 99%    Physical Exam Constitutional:      Appearance: She is well-developed.  HENT:     Head: Normocephalic and atraumatic.     Right Ear: Tympanic membrane, ear canal and external ear normal.     Left Ear: Tympanic membrane, ear canal and external ear normal.     Nose: Nose normal.     Mouth/Throat:     Pharynx: Oropharynx is clear. No posterior oropharyngeal erythema.  Eyes:     Conjunctiva/sclera: Conjunctivae normal.     Pupils: Pupils are equal, round, and reactive to light.  Neck:     Thyroid: No thyromegaly.  Cardiovascular:     Rate and Rhythm: Normal rate and regular rhythm.     Heart sounds: Normal heart sounds.  Pulmonary:     Effort: Pulmonary effort is normal.      Breath sounds: Normal breath sounds. No wheezing.     Comments: Coarse BS.  Musculoskeletal:     Cervical back: Neck supple. Tenderness present.  Lymphadenopathy:     Cervical: Cervical adenopathy present.  Skin:    General: Skin is warm and dry.  Neurological:     Mental Status: She is alert and oriented to person, place, and time.     No results found for any visits on 11/13/22.      Assessment & Plan:   Problem List Items Addressed This Visit   None Visit Diagnoses     Dizziness    -  Primary   Relevant Orders   CBC with Differential/Platelet   Hemoglobin A2Q   BASIC METABOLIC PANEL WITH GFR   Acute non-recurrent sinusitis, unspecified location       Relevant Medications   doxycycline (VIBRA-TABS) 100 MG tablet   Other Relevant Orders   CBC with Differential/Platelet   Hemoglobin J3H   BASIC METABOLIC PANEL WITH GFR   Elevated hemoglobin (HCC)       Relevant Orders   CBC with Differential/Platelet   Hemoglobin L4T   BASIC METABOLIC PANEL WITH GFR      Lightheadedness/dizziness-unclear etiology at  this point.  She has been going without food for long periods during the day so we discussed eating regularly at least 3 meals a day with protein is great.  Will get some up-to-date labs just to rule out acute systemic infection or electrolyte disturbance.  Acute sinusitis-we will go ahead and treat with doxycycline suspect bacterial infection after having a recent viral infection.  Call if not better in 1 week.  Elevated hemoglobin - repeat was better but she is a smoker.  Will recheck today.   Meds ordered this encounter  Medications   doxycycline (VIBRA-TABS) 100 MG tablet    Sig: Take 1 tablet (100 mg total) by mouth 2 (two) times daily.    Dispense:  14 tablet    Refill:  0    No follow-ups on file.  Beatrice Lecher, MD

## 2022-11-14 ENCOUNTER — Ambulatory Visit (HOSPITAL_BASED_OUTPATIENT_CLINIC_OR_DEPARTMENT_OTHER)
Admission: RE | Admit: 2022-11-14 | Discharge: 2022-11-14 | Disposition: A | Discharge status: Home / Self Care | Payer: Medicare Other | Source: Ambulatory Visit | Attending: Family Medicine | Admitting: Family Medicine

## 2022-11-14 DIAGNOSIS — R9431 Abnormal electrocardiogram [ECG] [EKG]: Secondary | ICD-10-CM | POA: Diagnosis not present

## 2022-11-14 LAB — BASIC METABOLIC PANEL WITH GFR
BUN: 9 mg/dL (ref 7–25)
CO2: 24 mmol/L (ref 20–32)
Calcium: 10.6 mg/dL — ABNORMAL HIGH (ref 8.6–10.4)
Chloride: 102 mmol/L (ref 98–110)
Creat: 0.82 mg/dL (ref 0.60–1.00)
Glucose, Bld: 102 mg/dL — ABNORMAL HIGH (ref 65–99)
Potassium: 4.4 mmol/L (ref 3.5–5.3)
Sodium: 135 mmol/L (ref 135–146)
eGFR: 75 mL/min/{1.73_m2} (ref >=60)

## 2022-11-14 LAB — CBC WITH DIFFERENTIAL/PLATELET
Absolute Monocytes: 875 cells/uL (ref 200–950)
Basophils Absolute: 81 cells/uL (ref 0–200)
Basophils Relative: 1 %
Eosinophils Absolute: 73 cells/uL (ref 15–500)
Eosinophils Relative: 0.9 %
HCT: 46.9 % — ABNORMAL HIGH (ref 35.0–45.0)
Hemoglobin: 16.4 g/dL — ABNORMAL HIGH (ref 11.7–15.5)
Lymphs Abs: 2835 cells/uL (ref 850–3900)
MCH: 31.1 pg (ref 27.0–33.0)
MCHC: 35 g/dL (ref 32.0–36.0)
MCV: 88.8 fL (ref 80.0–100.0)
MPV: 9.3 fL (ref 7.5–12.5)
Monocytes Relative: 10.8 %
Neutro Abs: 4236 cells/uL (ref 1500–7800)
Neutrophils Relative %: 52.3 %
Platelets: 305 10*3/uL (ref 140–400)
RBC: 5.28 10*6/uL — ABNORMAL HIGH (ref 3.80–5.10)
RDW: 12.3 % (ref 11.0–15.0)
Total Lymphocyte: 35 %
WBC: 8.1 10*3/uL (ref 3.8–10.8)

## 2022-11-14 LAB — HEMOGLOBIN A1C
Hgb A1c MFr Bld: 6 % of total Hgb — ABNORMAL HIGH (ref <5.7)
Mean Plasma Glucose: 126 mg/dL
eAG (mmol/L): 7 mmol/L

## 2022-11-14 NOTE — Progress Notes (Signed)
Hi Emine, your red blood cell count is pretty stable 16.4 it was 16.3 a week ago and it was 16.28 years ago.  Would you be interested in doing the genetic test for hemochromatosis.  That is a genetic issue where you make too many red blood cells.  If you would then please let me know and I can order that test and you can come at your convenience.  Your A1c does indicate some prediabetes.  This is not full-blown diabetes but this is when there is a slight dysregulation and glucose.  So it is important that you are eating small amounts throughout the day.  Try to minimize a lot of sugary foods and focus on proteins and vegetables.  Will plan to recheck the A1c again in 6 months to make sure that it is stable or might even look a little bit better.  Kidney function looks great.

## 2022-11-15 LAB — ECHOCARDIOGRAM COMPLETE
AR max vel: 2.17 cm2
AV Area VTI: 2.6 cm2
AV Area mean vel: 2.26 cm2
AV Mean grad: 3 mmHg
AV Peak grad: 5.1 mmHg
Ao pk vel: 1.13 m/s
Area-P 1/2: 2.32 cm2
Calc EF: 68 %
S' Lateral: 2.2 cm
Single Plane A2C EF: 61.7 %
Single Plane A4C EF: 72.4 %

## 2022-11-16 ENCOUNTER — Other Ambulatory Visit: Payer: Self-pay | Admitting: Family Medicine

## 2022-11-16 ENCOUNTER — Encounter: Payer: Self-pay | Admitting: Family Medicine

## 2022-11-16 DIAGNOSIS — E785 Hyperlipidemia, unspecified: Secondary | ICD-10-CM

## 2022-11-16 DIAGNOSIS — D582 Other hemoglobinopathies: Secondary | ICD-10-CM

## 2022-11-17 NOTE — Telephone Encounter (Signed)
Pt would like to know what labs are we following up on, and what labs are we ordering before she makes this appointment. She stated that she spoke with several nurses about this situation, and someone was going to call her back, and explain it to her. tvt

## 2022-11-17 NOTE — Progress Notes (Signed)
Alison Wilson, pumping function of your heart looks good at 60 to 65%.  This is normal.  Overall valves look good.  They are opening and closing normally.

## 2022-11-18 ENCOUNTER — Other Ambulatory Visit: Payer: Self-pay | Admitting: *Deleted

## 2022-11-18 ENCOUNTER — Other Ambulatory Visit: Payer: Medicare Other

## 2022-11-18 DIAGNOSIS — D582 Other hemoglobinopathies: Secondary | ICD-10-CM | POA: Diagnosis not present

## 2022-11-18 DIAGNOSIS — R718 Other abnormality of red blood cells: Secondary | ICD-10-CM | POA: Diagnosis not present

## 2022-11-18 NOTE — Telephone Encounter (Signed)
Orders Placed This Encounter  Procedures   Hemochromatosis DNA-PCR(c282y,h63d)   CBC   Pt coming for labs today

## 2022-11-19 ENCOUNTER — Encounter: Payer: Self-pay | Admitting: Family Medicine

## 2022-11-19 NOTE — Progress Notes (Signed)
Hi Alison Wilson,Iron looks OK, awaiting the DNA test. It will likely be a few days.

## 2022-11-27 ENCOUNTER — Other Ambulatory Visit: Payer: Medicare Other

## 2022-11-27 LAB — HEMOCHROMATOSIS DNA-PCR(C282Y,H63D)

## 2022-11-27 LAB — CBC
HCT: 49.9 % — ABNORMAL HIGH (ref 35.0–45.0)
Hemoglobin: 17 g/dL — ABNORMAL HIGH (ref 11.7–15.5)
MCH: 30.1 pg (ref 27.0–33.0)
MCHC: 34.1 g/dL (ref 32.0–36.0)
MCV: 88.3 fL (ref 80.0–100.0)
MPV: 9.1 fL (ref 7.5–12.5)
Platelets: 323 10*3/uL (ref 140–400)
RBC: 5.65 10*6/uL — ABNORMAL HIGH (ref 3.80–5.10)
RDW: 12.1 % (ref 11.0–15.0)
WBC: 8.8 10*3/uL (ref 3.8–10.8)

## 2022-11-27 LAB — IRON, TOTAL/TOTAL IRON BINDING CAP
%SAT: 26 % (calc) (ref 16–45)
Iron: 88 ug/dL (ref 45–160)
TIBC: 341 mcg/dL (calc) (ref 250–450)

## 2022-11-28 NOTE — Progress Notes (Signed)
Hi Alison Wilson news!  You are negative for hemochromatosis there is no DNA mutation present which is awesome.  My recommendation at this point would be to really work diligently at quitting smoking and then we can recheck your hemoglobin in about a month after you quit and see if it normalizes.  If it still elevated at that time always consider hematology referral.

## 2022-12-05 DIAGNOSIS — H2512 Age-related nuclear cataract, left eye: Secondary | ICD-10-CM | POA: Diagnosis not present

## 2022-12-05 DIAGNOSIS — H2513 Age-related nuclear cataract, bilateral: Secondary | ICD-10-CM | POA: Diagnosis not present

## 2022-12-05 DIAGNOSIS — H04123 Dry eye syndrome of bilateral lacrimal glands: Secondary | ICD-10-CM | POA: Diagnosis not present

## 2022-12-19 ENCOUNTER — Encounter: Payer: Self-pay | Admitting: Family Medicine

## 2022-12-19 NOTE — Telephone Encounter (Signed)
Not sure if she is wanting in person visit or just a lab drawl but I am fine with either for the A1c.

## 2022-12-31 ENCOUNTER — Encounter: Payer: Self-pay | Admitting: Family Medicine

## 2022-12-31 ENCOUNTER — Ambulatory Visit (INDEPENDENT_AMBULATORY_CARE_PROVIDER_SITE_OTHER): Payer: Medicare Other | Admitting: Family Medicine

## 2022-12-31 VITALS — BP 130/79 | HR 73 | Ht 66.0 in | Wt 117.0 lb

## 2022-12-31 DIAGNOSIS — D582 Other hemoglobinopathies: Secondary | ICD-10-CM

## 2022-12-31 DIAGNOSIS — I10 Essential (primary) hypertension: Secondary | ICD-10-CM

## 2022-12-31 NOTE — Progress Notes (Signed)
Established Patient Office Visit  Subjective   Patient ID: Breda Dhein, female    DOB: 1950-11-03  Age: 72 y.o. MRN: [id number]  Chief Complaint  Patient presents with   Follow-up         HPI  Is here today to discuss her lab salts from February and she has had some questions and concerns.  Her hemoglobin was elevated.  Since then she has mostly quit smoking she did have a few puffs of a cigarette yesterday but says that she primarily has quit.  We discussed that that could certainly be a potential cause.  She was negative for hemochromatosis testing.  Initially she was also try to eat a very low iron diet but has switched gears on that recently.  She was also noted to have an elevated hemoglobin A1c up to 6.0 in the prediabetes range so she is also made some major changes in her diet in that regards.  Her calcium level was also elevated so she also has cut out dairy.  In general she says she does feel better over the last several weeks.  And wants to see if these changes are improving her numbers.  Also since making some of the dietary changes she has lost a few pounds.  Her husband feels that she is not eating quite enough.  She is also been avoiding red meat she is only had it once in the last month.    ROS    Objective:     BP 130/79   Pulse 73   Ht 5\' 6"  (1.676 m)   Wt 117 lb (53.1 kg)   SpO2 99%   BMI 18.88 kg/m    Physical Exam Vitals reviewed.  Constitutional:      Appearance: She is well-developed.  HENT:     Head: Normocephalic and atraumatic.  Eyes:     Conjunctiva/sclera: Conjunctivae normal.  Cardiovascular:     Rate and Rhythm: Normal rate.  Pulmonary:     Effort: Pulmonary effort is normal.  Skin:    General: Skin is dry.     Coloration: Skin is not pale.  Neurological:     Mental Status: She is alert and oriented to person, place, and time.  Psychiatric:        Behavior: Behavior normal.      No results found for any visits on  12/31/22.    The 10-year ASCVD risk score (Arnett DK, et al., 2019) is: 25%    Assessment & Plan:   Problem List Items Addressed This Visit       Cardiovascular and Mediastinum   Primary hypertension   Relevant Orders   CBC with Differential/Platelet   COMPLETE METABOLIC PANEL WITH GFR   Fe+TIBC+Fer   Other Visit Diagnoses     Elevated hemoglobin ([place])    -  Primary   Relevant Orders   CBC with Differential/Platelet   COMPLETE METABOLIC PANEL WITH GFR   Fe+TIBC+Fer   Serum calcium elevated       Relevant Orders   CBC with Differential/Platelet   COMPLETE METABOLIC PANEL WITH GFR   Fe+TIBC+Fer      We discussed the elevated hemoglobin and calcium levels.  I am super excited that she is working on quitting smoking.  Will plan to recheck levels today to see if they are improving.  We did discuss that we cannot recheck her A1c until May it has to be 90 days.  And so we will check  that at the beginning of May she is can go ahead and make an appointment.  She does have a Medicare wellness exam scheduled for tomorrow.  No follow-ups on file.   I spent 30 minutes on the day of the encounter to include pre-visit record review, face-to-face time with the patient and post visit ordering of test.   Beatrice Lecher, MD

## 2023-01-01 ENCOUNTER — Ambulatory Visit (INDEPENDENT_AMBULATORY_CARE_PROVIDER_SITE_OTHER): Payer: Medicare Other | Admitting: Family Medicine

## 2023-01-01 DIAGNOSIS — Z0000 Encounter for general adult medical examination without abnormal findings: Secondary | ICD-10-CM

## 2023-01-01 DIAGNOSIS — Z1211 Encounter for screening for malignant neoplasm of colon: Secondary | ICD-10-CM

## 2023-01-01 DIAGNOSIS — Z780 Asymptomatic menopausal state: Secondary | ICD-10-CM

## 2023-01-01 DIAGNOSIS — Z1231 Encounter for screening mammogram for malignant neoplasm of breast: Secondary | ICD-10-CM

## 2023-01-01 DIAGNOSIS — Z122 Encounter for screening for malignant neoplasm of respiratory organs: Secondary | ICD-10-CM

## 2023-01-01 NOTE — Progress Notes (Signed)
MEDICARE ANNUAL WELLNESS VISIT  01/01/2023  Telephone Visit Disclaimer This Medicare AWV was conducted by telephone due to national recommendations for restrictions regarding the COVID-19 Pandemic (e.g. social distancing).  I verified, using two identifiers, that I am speaking with Alison Wilson or their authorized healthcare agent. I discussed the limitations, risks, security, and privacy concerns of performing an evaluation and management service by telephone and the potential availability of an in-person appointment in the future. The patient expressed understanding and agreed to proceed.  Location of Patient: Home Location of Provider (nurse):  In the office.  Subjective:    Alison Wilson is a 72 y.o. female patient of Metheney, Rene Kocher, MD who had a Medicare Annual Wellness Visit today via telephone. Alison Wilson is Retired and lives with their spouse. she has 2 children. she reports that she is socially active and does interact with friends/family regularly. she is moderately physically active and enjoys outdoors, sewing and scrapbooking.  Patient Care Team: Hali Marry, MD as PCP - General (Family Medicine)     01/01/2023    8:11 AM 03/08/2014   11:35 AM  Advanced Directives  Does Patient Have a Medical Advance Directive? Yes Patient has advance directive, copy not in chart  Type of Advance Directive Living will [place]  Does patient want to make changes to medical advance directive? No - Patient declined     Hospital Utilization Over the Past 12 Months: # of hospitalizations or ER visits: 1  # of surgeries: 0  Review of Systems    Patient reports that her overall health is  about the same  compared to last year.  History obtained from chart review and the patient  Patient Reported Readings (BP, Pulse, CBG, Weight, etc) none  Pain Assessment Pain : No/denies pain     Current Medications & Allergies (verified) Allergies as of 01/01/2023        Reactions   Dexilant [dexlansoprazole] Other (See Comments)   Pt says felt more SOB on med but was also being tx'd for bronchitis at the time.    Cephalexin Rash        Medication List        Accurate as of January 01, 2023  8:37 AM. If you have any questions, ask your nurse or doctor.          atorvastatin 20 MG tablet Commonly known as: LIPITOR TAKE 1 TABLET BY MOUTH EVERY DAY   ketorolac 0.5 % ophthalmic solution Commonly known as: ACULAR Place 1 drop into both eyes 4 (four) times daily.   losartan 50 MG tablet Commonly known as: COZAAR Take 1 tablet (50 mg total) by mouth daily.   prednisoLONE acetate 1 % ophthalmic suspension Commonly known as: PRED FORTE PLEASE SEE ATTACHED FOR DETAILED DIRECTIONS        History (reviewed): Past Medical History:  Diagnosis Date   Fibrocystic breast    Herpes 10/13/1992   Osteoarthritis    Past Surgical History:  Procedure Laterality Date   ABDOMINAL HYSTERECTOMY  01/12/1995   fibroid, still has her ovaries.    BREAST EXCISIONAL BIOPSY Right 1989   Cyst on left ovary removed  10/13/1994   TONSILLECTOMY AND ADENOIDECTOMY  10/14/1955   History reviewed. No pertinent family history. Social History   Socioeconomic History   Marital status: Married    Spouse name: [name]   Number of children: 2   Years of education: 13   Highest education level: Some college, no  degree  Occupational History   Occupation: Retired  Tobacco Use   Smoking status: Former    Packs/day: 1    Types: Cigarettes    Quit date: 12/30/2022   Smokeless tobacco: Never   Tobacco comments:    1 pack a day   Vaping Use   Vaping Use: Never used  Substance and Sexual Activity   Alcohol use: No   Drug use: No   Sexual activity: Not Currently  Other Topics Concern   Not on file  Social History Narrative   Lives with her husband. She has two children. She enjoys being outdoors, sewing and scrapbooking.   Social Determinants of Health    Financial Resource Strain: Low Risk  (01/01/2023)   Overall Financial Resource Strain (CARDIA)    Difficulty of Paying Living Expenses: Not hard at all  Food Insecurity: No Food Insecurity (01/01/2023)   Hunger Vital Sign    Worried About Running Out of Food in the Last Year: Never true    Ran Out of Food in the Last Year: Never true  Transportation Needs: No Transportation Needs (01/01/2023)   PRAPARE - Hydrologist (Medical): No    Lack of Transportation (Non-Medical): No  Physical Activity: Insufficiently Active (01/01/2023)   Exercise Vital Sign    Days of Exercise per Week: 4 days    Minutes of Exercise per Session: 30 min  Stress: No Stress Concern Present (01/01/2023)   [place]    Feeling of Stress : Not at all  Social Connections: [place] (01/01/2023)   Social Connection and Isolation Panel [NHANES]    Frequency of Communication with Friends and Family: Twice a week    Frequency of Social Gatherings with Friends and Family: Once a week    Attends Religious Services: 1 to 4 times per year    Active Member of [employer] or Organizations: Yes    Attends Archivist Meetings: More than 4 times per year    Marital Status: Married    Activities of Daily Living    01/01/2023    8:22 AM  In your present state of health, do you have any difficulty performing the following activities:  Hearing? 0  Vision? 1  Comment due to have cataract surgery  Difficulty concentrating or making decisions? 0  Walking or climbing stairs? 0  Dressing or bathing? 0  Doing errands, shopping? 0  Preparing Food and eating ? N  Using the Toilet? N  In the past six months, have you accidently leaked urine? N  Do you have problems with loss of bowel control? N  Managing your Medications? N  Managing your Finances? N  Housekeeping or managing your Housekeeping? N    Patient Education/  Literacy How often do you need to have someone help you when you read instructions, pamphlets, or other written materials from your doctor or pharmacy?: 1 - Never What is the last grade level you completed in school?: 1 year of college  Exercise Current Exercise Habits: Home exercise routine, Type of exercise: walking, Time (Minutes): 30, Frequency (Times/Week): 4, Weekly Exercise (Minutes/Week): 120, Intensity: Moderate, Exercise limited by: None identified  Diet Patient reports consuming 3 meals a day and 2 snack(s) a day Patient reports that her primary diet is: Regular Patient reports that she does have regular access to food.   Depression Screen    01/01/2023    8:12 AM 10/17/2022  3:53 PM 09/11/2022    9:37 AM 01/01/2022   11:39 AM 12/04/2021    9:18 AM 02/07/2020   10:07 AM 05/08/2017    1:28 PM  PHQ 2/9 Scores  PHQ - 2 Score 0 0 0 0 0 0 0     Fall Risk    01/01/2023    8:09 AM 12/31/2022    9:45 AM 11/13/2022   12:05 PM 10/17/2022    3:52 PM 09/11/2022    9:37 AM  Fall Risk   Falls in the past year? 0 0 0 0 0  Number falls in past yr: 0 0  0 0  Injury with Fall? 0 0 0 0 0  Risk for fall due to : No Fall Risks No Fall Risks No Fall Risks No Fall Risks No Fall Risks  Follow up Falls evaluation completed Falls evaluation completed Falls evaluation completed Falls evaluation completed Falls evaluation completed     Objective:  Alison Wilson seemed alert and oriented and she participated appropriately during our telephone visit.  Blood Pressure Weight BMI  BP Readings from Last 3 Encounters:  12/31/22 130/79  11/13/22 137/78  10/17/22 134/80   Wt Readings from Last 3 Encounters:  12/31/22 117 lb (53.1 kg)  10/17/22 123 lb (55.8 kg)  09/11/22 123 lb (55.8 kg)   BMI Readings from Last 1 Encounters:  12/31/22 18.88 kg/m    *Unable to obtain current vital signs, weight, and BMI due to telephone visit type  Hearing/Vision  Alison Wilson did not seem to have difficulty with  hearing/understanding during the telephone conversation Reports that she has had a formal eye exam by an eye care professional within the past year Reports that she has not had a formal hearing evaluation within the past year *Unable to fully assess hearing and vision during telephone visit type  Cognitive Function:    01/01/2023    8:24 AM  6CIT Screen  What Year? 0 points  What month? 0 points  What time? 0 points  Count back from 20 0 points  Months in reverse 0 points  Repeat phrase 0 points  Total Score 0 points   (Normal:0-7, Significant for Dysfunction: >8)  Normal Cognitive Function Screening: Yes   Immunization & Health Maintenance Record Immunization History  Administered Date(s) Administered   Fluad Quad(high Dose 65+) 11/01/2021, 09/11/2022   Influenza,inj,Quad PF,6+ Mos 08/10/2013, 07/26/2014, 07/11/2015, 08/28/2016, 08/17/2019   Moderna Sars-Covid-2 Vaccination 11/26/2019, 12/24/2019, 08/11/2020, 05/01/2021   PNEUMOCOCCAL CONJUGATE-20 12/04/2021   Pfizer Covid-19 Vaccine Bivalent Booster 5y-11y 08/14/2021   Tdap 06/19/2011   Zoster, Live 08/12/2011    Health Maintenance  Topic Date Due   DTaP/Tdap/Td (2 - Td or Tdap) 06/18/2021   Fecal DNA (Cologuard)  01/07/2023 (Originally 05/27/2020)   Hepatitis C Screening  09/12/2023 (Originally 07/18/1969)   COVID-19 Vaccine (6 - 2023-24 season) 09/28/2023 (Originally 06/13/2022)   Zoster Vaccines- Shingrix (1 of 2) 10/11/2023 (Originally 07/18/1999)   DEXA SCAN  12/31/2023 (Originally 07/17/2014)   MAMMOGRAM  12/12/2023   Medicare Annual Wellness (AWV)  01/01/2024   Pneumonia Vaccine 21+ Years old  Completed   INFLUENZA VACCINE  Completed   HPV VACCINES  Aged Out       Assessment  This is a routine wellness examination for Alison Wilson.  Health Maintenance: Due or Overdue Health Maintenance Due  Topic Date Due   DTaP/Tdap/Td (2 - Td or Tdap) 06/18/2021    Alison Wilson does not need a referral for Community  Assistance: Care Management:  no Social Work:    no Prescription Assistance:  no Nutrition/Diabetes Education:  no   Plan:  Personalized Goals  Goals Addressed               This Visit's Progress     Patient Stated (pt-stated)        01/01/2023 AWV Goal: Improved Nutrition/Diet  Patient will verbalize understanding that diet plays an important role in overall health and that a poor diet is a risk factor for many chronic medical conditions.  Over the next year, patient will improve self management of their diet by incorporating better food choices. Patient will utilize available community resources to help with food acquisition if needed (ex: food pantries, [address], etc) Patient will work with nutrition specialist if a referral was made        Personalized Health Maintenance & Screening Recommendations  Td vaccine Screening mammography Bone densitometry screening Colorectal cancer screening Shingles vaccine Hep C screening  Lung Cancer Screening Recommended: yes (Low Dose CT Chest recommended if Age 60-80 years, 30 pack-year currently smoking OR have quit w/in past 15 years) Hepatitis C Screening recommended:yes HIV Screening recommended: no  Advanced Directives: Written information was not prepared per patient's request.  Referrals & Orders Orders Placed This Encounter  Procedures   [place]   Ambulatory Referral Lung Cancer Screening [facility]    Follow-up Plan Follow-up with Hali Marry, MD as planned Schedule shingles and tetanus vaccines at the pharmacy.  Cologuard, mammogram, bone density and lung cancer screening referral placed. Medicare wellness visit in one year. Patient will access AVS on my chart.   I have personally reviewed and noted the following in the patient's chart:   Medical and social history Use of alcohol, tobacco or illicit drugs  Current medications and  supplements Functional ability and status Nutritional status Physical activity Advanced directives List of other physicians Hospitalizations, surgeries, and ER visits in previous 12 months Vitals Screenings to include cognitive, depression, and falls Referrals and appointments  In addition, I have reviewed and discussed with Alison Wilson certain preventive protocols, quality metrics, and best practice recommendations. A written personalized care plan for preventive services as well as general preventive health recommendations is available and can be mailed to the patient at her request.      Tinnie Gens, RN BSN  01/01/2023

## 2023-01-01 NOTE — Patient Instructions (Addendum)
[place] Maintenance Summary and Written Plan of Care  Ms. Alison Wilson ,  Thank you for allowing me to perform your Medicare Annual Wellness Visit and for your ongoing commitment to your health.   Health Maintenance & Immunization History Health Maintenance  Topic Date Due   DTaP/Tdap/Td (2 - Td or Tdap) 06/18/2021   Fecal DNA (Cologuard)  01/07/2023 (Originally 05/27/2020)   Hepatitis C Screening  09/12/2023 (Originally 07/18/1969)   COVID-19 Vaccine (6 - 2023-24 season) 09/28/2023 (Originally 06/13/2022)   Zoster Vaccines- Shingrix (1 of 2) 10/11/2023 (Originally 07/18/1999)   DEXA SCAN  12/31/2023 (Originally 07/17/2014)   MAMMOGRAM  12/12/2023   Medicare Annual Wellness (AWV)  01/01/2024   Pneumonia Vaccine 23+ Years old  Completed   INFLUENZA VACCINE  Completed   HPV VACCINES  Aged Out   Immunization History  Administered Date(s) Administered   Fluad Quad(high Dose 65+) 11/01/2021, 09/11/2022   Influenza,inj,Quad PF,6+ Mos 08/10/2013, 07/26/2014, 07/11/2015, 08/28/2016, 08/17/2019   Moderna Sars-Covid-2 Vaccination 11/26/2019, 12/24/2019, 08/11/2020, 05/01/2021   PNEUMOCOCCAL CONJUGATE-20 12/04/2021   Pfizer Covid-19 Vaccine Bivalent Booster 5y-11y 08/14/2021   Tdap 06/19/2011   Zoster, Live 08/12/2011    These are the patient goals that we discussed:  Goals Addressed               This Visit's Progress     Patient Stated (pt-stated)        01/01/2023 AWV Goal: Improved Nutrition/Diet  Patient will verbalize understanding that diet plays an important role in overall health and that a poor diet is a risk factor for many chronic medical conditions.  Over the next year, patient will improve self management of their diet by incorporating better food choices. Patient will utilize available community resources to help with food acquisition if needed (ex: food pantries, [address], etc) Patient will work with nutrition specialist if a referral was made           This is a list of Health Maintenance Items that are overdue or due now: Health Maintenance Due  Topic Date Due   DTaP/Tdap/Td (2 - Td or Tdap) 06/18/2021   Td vaccine Screening mammography Bone densitometry screening Colorectal cancer screening Shingles vaccine Hep C screening  Orders/Referrals Placed Today: Orders Placed This Encounter  Procedures   DEXAScan    Standing Status:   Future    Standing Expiration Date:   01/01/2024    Scheduling Instructions:     Please call patient to schedule    Order Specific Question:   Reason for exam:    Answer:   post menopausal    Order Specific Question:   Preferred imaging location?    Answer:   [facility]   Mammogram 3D SCREEN BREAST BILATERAL    Standing Status:   Future    Standing Expiration Date:   01/01/2024    Scheduling Instructions:     Please call patient to schedule.    Order Specific Question:   Reason for Exam (SYMPTOM  OR DIAGNOSIS REQUIRED)    Answer:   breast cancer screening    Order Specific Question:   Preferred imaging location?    Answer:   [facility]   Cologuard   Ambulatory Referral Lung Cancer Screening [facility]    Referral Priority:   Routine    Referral Type:   Consultation    Referral Reason:   Specialty Services Required    Number of Visits Requested:   1   (Contact our referral  department at [phone] if you have not spoken with someone about your referral appointment within the next 5 days)    Follow-up Plan Follow-up with Hali Marry, MD as planned Schedule shingles and tetanus vaccines at the pharmacy.  Cologuard, mammogram, bone density and lung cancer screening referral placed. Medicare wellness visit in one year. Patient will access AVS on my chart.      Health Maintenance, Female Adopting a healthy lifestyle and getting preventive care are important in promoting health and wellness. Ask your health care provider about: The right  schedule for you to have regular tests and exams. Things you can do on your own to prevent diseases and keep yourself healthy. What should I know about diet, weight, and exercise? Eat a healthy diet  Eat a diet that includes plenty of vegetables, fruits, low-fat dairy products, and lean protein. Do not eat a lot of foods that are high in solid fats, added sugars, or sodium. Maintain a healthy weight Body mass index (BMI) is used to identify weight problems. It estimates body fat based on height and weight. Your health care provider can help determine your BMI and help you achieve or maintain a healthy weight. Get regular exercise Get regular exercise. This is one of the most important things you can do for your health. Most adults should: Exercise for at least 150 minutes each week. The exercise should increase your heart rate and make you sweat (moderate-intensity exercise). Do strengthening exercises at least twice a week. This is in addition to the moderate-intensity exercise. Spend less time sitting. Even light physical activity can be beneficial. Watch cholesterol and blood lipids Have your blood tested for lipids and cholesterol at 74 years of age, then have this test every 5 years. Have your cholesterol levels checked more often if: Your lipid or cholesterol levels are high. You are older than 74 years of age. You are at high risk for heart disease. What should I know about cancer screening? Depending on your health history and family history, you may need to have cancer screening at various ages. This may include screening for: Breast cancer. Cervical cancer. Colorectal cancer. Skin cancer. Lung cancer. What should I know about heart disease, diabetes, and high blood pressure? Blood pressure and heart disease High blood pressure causes heart disease and increases the risk of stroke. This is more likely to develop in people who have high blood pressure readings or are  overweight. Have your blood pressure checked: Every 3-5 years if you are 65-49 years of age. Every year if you are 71 years old or older. Diabetes Have regular diabetes screenings. This checks your fasting blood sugar level. Have the screening done: Once every three years after age 81 if you are at a normal weight and have a low risk for diabetes. More often and at a younger age if you are overweight or have a high risk for diabetes. What should I know about preventing infection? Hepatitis B If you have a higher risk for hepatitis B, you should be screened for this virus. Talk with your health care provider to find out if you are at risk for hepatitis B infection. Hepatitis C Testing is recommended for: Everyone born from [birth year] through 1967. Anyone with known risk factors for hepatitis C. Sexually transmitted infections (STIs) Get screened for STIs, including gonorrhea and chlamydia, if: You are sexually active and are younger than 74 years of age. You are older than 74 years of age and your health care  provider tells you that you are at risk for this type of infection. Your sexual activity has changed since you were last screened, and you are at increased risk for chlamydia or gonorrhea. Ask your health care provider if you are at risk. Ask your health care provider about whether you are at high risk for HIV. Your health care provider may recommend a prescription medicine to help prevent HIV infection. If you choose to take medicine to prevent HIV, you should first get tested for HIV. You should then be tested every 3 months for as long as you are taking the medicine. Pregnancy If you are about to stop having your period (premenopausal) and you may become pregnant, seek counseling before you get pregnant. Take 400 to 800 micrograms (mcg) of folic acid every day if you become pregnant. Ask for birth control (contraception) if you want to prevent pregnancy. Osteoporosis and  menopause Osteoporosis is a disease in which the bones lose minerals and strength with aging. This can result in bone fractures. If you are 63 years old or older, or if you are at risk for osteoporosis and fractures, ask your health care provider if you should: Be screened for bone loss. Take a calcium or vitamin D supplement to lower your risk of fractures. Be given hormone replacement therapy (HRT) to treat symptoms of menopause. Follow these instructions at home: Alcohol use Do not drink alcohol if: Your health care provider tells you not to drink. You are pregnant, may be pregnant, or are planning to become pregnant. If you drink alcohol: Limit how much you have to: 0-1 drink a day. Know how much alcohol is in your drink. In the [place] one drink equals one 12 oz bottle of beer (355 mL), one 5 oz glass of wine (148 mL), or one 1 oz glass of hard liquor (44 mL). Lifestyle Do not use any products that contain nicotine or tobacco. These products include cigarettes, chewing tobacco, and vaping devices, such as e-cigarettes. If you need help quitting, ask your health care provider. Do not use street drugs. Do not share needles. Ask your health care provider for help if you need support or information about quitting drugs. General instructions Schedule regular health, dental, and eye exams. Stay current with your vaccines. Tell your health care provider if: You often feel depressed. You have ever been abused or do not feel safe at home. Summary Adopting a healthy lifestyle and getting preventive care are important in promoting health and wellness. Follow your health care provider's instructions about healthy diet, exercising, and getting tested or screened for diseases. Follow your health care provider's instructions on monitoring your cholesterol and blood pressure. This information is not intended to replace advice given to you by your health care provider. Make sure you discuss any  questions you have with your health care provider. Document Revised: 02/18/2021 Document Reviewed: 02/18/2021 Elsevier Patient Education  [place].

## 2023-01-02 ENCOUNTER — Other Ambulatory Visit: Payer: Medicare Other

## 2023-01-02 DIAGNOSIS — D582 Other hemoglobinopathies: Secondary | ICD-10-CM | POA: Diagnosis not present

## 2023-01-02 DIAGNOSIS — I10 Essential (primary) hypertension: Secondary | ICD-10-CM | POA: Diagnosis not present

## 2023-01-03 LAB — CBC WITH DIFFERENTIAL/PLATELET
Absolute Monocytes: 824 cells/uL (ref 200–950)
Basophils Absolute: 87 cells/uL (ref 0–200)
Basophils Relative: 1.3 %
Eosinophils Absolute: 141 cells/uL (ref 15–500)
Eosinophils Relative: 2.1 %
HCT: 48.9 % — ABNORMAL HIGH (ref 35.0–45.0)
Hemoglobin: 16.3 g/dL — ABNORMAL HIGH (ref 11.7–15.5)
Lymphs Abs: 2539 cells/uL (ref 850–3900)
MCH: 30.1 pg (ref 27.0–33.0)
MCHC: 33.3 g/dL (ref 32.0–36.0)
MCV: 90.4 fL (ref 80.0–100.0)
MPV: 9.7 fL (ref 7.5–12.5)
Monocytes Relative: 12.3 %
Neutro Abs: 3109 cells/uL (ref 1500–7800)
Neutrophils Relative %: 46.4 %
Platelets: 276 10*3/uL (ref 140–400)
RBC: 5.41 10*6/uL — ABNORMAL HIGH (ref 3.80–5.10)
RDW: 12.5 % (ref 11.0–15.0)
Total Lymphocyte: 37.9 %
WBC: 6.7 10*3/uL (ref 3.8–10.8)

## 2023-01-03 LAB — IRON,TIBC AND FERRITIN PANEL
%SAT: 46 % (calc) — ABNORMAL HIGH (ref 16–45)
Ferritin: 71 ng/mL (ref 16–288)
Iron: 154 ug/dL (ref 45–160)
TIBC: 333 mcg/dL (calc) (ref 250–450)

## 2023-01-03 LAB — COMPLETE METABOLIC PANEL WITH GFR
AG Ratio: 1.6 (calc) (ref 1.0–2.5)
ALT: 13 U/L (ref 6–29)
AST: 16 U/L (ref 10–35)
Albumin: 4.4 g/dL (ref 3.6–5.1)
Alkaline phosphatase (APISO): 71 U/L (ref 37–153)
BUN: 14 mg/dL (ref 7–25)
CO2: 24 mmol/L (ref 20–32)
Calcium: 10.5 mg/dL — ABNORMAL HIGH (ref 8.6–10.4)
Chloride: 105 mmol/L (ref 98–110)
Creat: 0.96 mg/dL (ref 0.60–1.00)
Globulin: 2.7 g/dL (calc) (ref 1.9–3.7)
Glucose, Bld: 93 mg/dL (ref 65–99)
Potassium: 5.1 mmol/L (ref 3.5–5.3)
Sodium: 140 mmol/L (ref 135–146)
Total Bilirubin: 0.8 mg/dL (ref 0.2–1.2)
Total Protein: 7.1 g/dL (ref 6.1–8.1)
eGFR: 62 mL/min/{1.73_m2} (ref >=60)

## 2023-01-05 NOTE — Progress Notes (Signed)
HI Cecille Rubin,  I am excited to see that your hemoglobin is back down to 16.3.  This is similar to what it was about a month ago and even as far out as 8 years ago it was 16.28 years ago.  Will plan to recheck it again in about 3 to 4 months.  Calcium is 10.5 so just off by 1/10 of a point.  I know you had cut back on calcium supplementation so just continue to do that and we will recheck again in 3 to 4 months as well.  If not rising or trending upward which is very reassuring.  Again looking back you around 10.5 even 10  years ago, so this is very stable.  Ferritin looks fantastic at 71.  You have good iron levels and good iron stores.  I think you had mentioned you would actually been avoiding extra iron so it is unusual to have gone up in a month. But again it is still normal so we can recheck in 3 months as well.  Please Schedule a follow-up around that time to go over the results as well.

## 2023-01-07 ENCOUNTER — Encounter: Payer: Self-pay | Admitting: Family Medicine

## 2023-01-07 ENCOUNTER — Ambulatory Visit (INDEPENDENT_AMBULATORY_CARE_PROVIDER_SITE_OTHER): Payer: Medicare Other

## 2023-01-07 DIAGNOSIS — M810 Age-related osteoporosis without current pathological fracture: Secondary | ICD-10-CM | POA: Diagnosis not present

## 2023-01-07 DIAGNOSIS — Z0000 Encounter for general adult medical examination without abnormal findings: Secondary | ICD-10-CM

## 2023-01-07 DIAGNOSIS — Z780 Asymptomatic menopausal state: Secondary | ICD-10-CM | POA: Diagnosis not present

## 2023-01-07 NOTE — Progress Notes (Signed)
Hi Kabella, bone density shows a T-score -2.8 this is consistent with osteoporosis.   The current recommendation for osteoporosis treatment includes:   #1 calcium-total of 1200 mg of calcium daily.  If you eat a very calcium rich diet you may be able to obtain that without a supplement.  If not, then I recommend calcium 500 mg twice a day.  There are several products over-the-counter such as Caltrate D and Viactiv chews which are great options that contain calcium and vitamin D. #2 vitamin D-recommend 800 international units daily. #3 exercise-recommend 30 minutes of weightbearing exercise 3 days a week.  Resistance training ,such as doing bands and light weights, can be particularly helpful. #4 medication-if you are not currently on a bone builder, also called a bisphosphonate, then this has been shown to be very helpful in maintaining bone strength, preventing further thinning of the bones, and reducing your risk for fractures.  I would highly recommend that you consider starting 1 of these medications.  If you are okay with that then please let us know and we will send one to your pharmacy.  If you would like to discuss further we are happy to make an appointment for you so that we can go over options for treatment.

## 2023-01-12 DIAGNOSIS — Z1211 Encounter for screening for malignant neoplasm of colon: Secondary | ICD-10-CM | POA: Diagnosis not present

## 2023-01-16 ENCOUNTER — Encounter: Payer: Self-pay | Admitting: Family Medicine

## 2023-01-16 DIAGNOSIS — R195 Other fecal abnormalities: Secondary | ICD-10-CM

## 2023-01-16 LAB — COLOGUARD: COLOGUARD: POSITIVE — AB

## 2023-01-16 NOTE — Progress Notes (Signed)
HI Alison Wilson, your cologuard is positive. This means you are higher risk so we need to get you schedule for a colonoscopy to take a closer look.  Do you have a preference for provider or location for a GI doc?

## 2023-01-20 ENCOUNTER — Encounter: Payer: Self-pay | Admitting: Family Medicine

## 2023-01-20 ENCOUNTER — Telehealth (INDEPENDENT_AMBULATORY_CARE_PROVIDER_SITE_OTHER): Payer: Medicare Other | Admitting: Family Medicine

## 2023-01-20 VITALS — BP 124/75 | HR 73

## 2023-01-20 DIAGNOSIS — D582 Other hemoglobinopathies: Secondary | ICD-10-CM

## 2023-01-20 DIAGNOSIS — R195 Other fecal abnormalities: Secondary | ICD-10-CM

## 2023-01-20 DIAGNOSIS — I10 Essential (primary) hypertension: Secondary | ICD-10-CM

## 2023-01-20 NOTE — Progress Notes (Signed)
Virtual Visit via Video Note  I connected with Alison Wilson on 01/20/23 at  1:00 PM EDT by a video enabled telemedicine application and verified that I am speaking with the correct person using two identifiers.   I discussed the limitations of evaluation and management by telemedicine and the availability of in person appointments. The patient expressed understanding and agreed to proceed.  Patient location: at home Provider location: in office  Subjective:    CC:   Chief Complaint  Patient presents with   reults    Cologuard and bone density    HPI: Did her Cologuard and came back positive.  Wants to discuss the results. [facility] called her this AM to schedule.   Donated blood for her elevated iron. She felt great for a couple of days and then started feeling lightheaded but she is feeling some better. Hydrating well.   Past medical history, Surgical history, Family history not pertinant except as noted below, Social history, Allergies, and medications have been entered into the medical record, reviewed, and corrections made.    Objective:    General: Speaking clearly in complete sentences without any shortness of breath.  Alert and oriented x3.  Normal judgment. No apparent acute distress.    Impression and Recommendations:    Problem List Items Addressed This Visit       Cardiovascular and Mediastinum   Primary hypertension    Pressure looks fantastic at home today.  Related labs on May 1 so in about 4 weeks.      Relevant Orders   Hemoglobin A1c   COMPLETE METABOLIC PANEL WITH GFR   CBC with Differential/Platelet   Fe+TIBC+Fer   Other Visit Diagnoses     Positive colorectal cancer screening using Cologuard test    -  Primary   Relevant Orders   Hemoglobin A1c   COMPLETE METABOLIC PANEL WITH GFR   CBC with Differential/Platelet   Fe+TIBC+Fer   Elevated hemoglobin       Relevant Orders   Hemoglobin A1c   COMPLETE METABOLIC PANEL WITH GFR   CBC with  Differential/Platelet   Fe+TIBC+Fer   Serum calcium elevated       Relevant Orders   Hemoglobin A1c   COMPLETE METABOLIC PANEL WITH GFR   CBC with Differential/Platelet   Fe+TIBC+Fer   Iron excess       Relevant Orders   Hemoglobin A1c   COMPLETE METABOLIC PANEL WITH GFR   CBC with Differential/Platelet   Fe+TIBC+Fer      Did review the positive Cologuard results.  It does not mean that she has colon cancer but does show that she could be at higher risk and so it is important for her to get the colonoscopy done.  The GI office has already contacted her and she will give them a call back to get scheduled.  Is not currently having any concerning symptoms.  Evaded hemoglobin and iron excess-she did donate blood.  Negative for hemochromatosis.  She had of couple days where she felt particularly tired afterwards but is doing well.  Due to recheck elevated serum calcium as well as renal function.  Orders Placed This Encounter  Procedures   Hemoglobin A1c   COMPLETE METABOLIC PANEL WITH GFR   CBC with Differential/Platelet   Fe+TIBC+Fer    No orders of the defined types were placed in this encounter.    I discussed the assessment and treatment plan with the patient. The patient was provided an opportunity to ask questions  and all were answered. The patient agreed with the plan and demonstrated an understanding of the instructions.   The patient was advised to call back or seek an in-person evaluation if the symptoms worsen or if the condition fails to improve as anticipated.  I spent 22 minutes on the day of the encounter to include pre-visit record review, face-to-face time with the patient and post visit ordering of test.  Nani Gasser, MD

## 2023-01-20 NOTE — Assessment & Plan Note (Addendum)
Pressure looks fantastic at home today.  Related labs on May 1 so in about 4 weeks.

## 2023-01-22 ENCOUNTER — Encounter: Payer: Self-pay | Admitting: Emergency Medicine

## 2023-01-22 ENCOUNTER — Ambulatory Visit
Admission: EM | Admit: 2023-01-22 | Discharge: 2023-01-22 | Disposition: A | Discharge status: Home / Self Care | Payer: Medicare Other | Attending: Family Medicine | Admitting: Family Medicine

## 2023-01-22 DIAGNOSIS — R531 Weakness: Secondary | ICD-10-CM | POA: Diagnosis not present

## 2023-01-22 NOTE — ED Provider Notes (Signed)
[facility]    CSN: [id number] Arrival date & time: 01/22/23  1047      History   Chief Complaint Chief Complaint  Patient presents with   Weakness    HPI Alison Wilson is a 72 y.o. female.   HPI 72 year old female presents with weakness since blood draw on Wednesday, 01/14/23.  Reports near syncopal episode episodes and fatigue in waves.  Patient reports changing diet over the past 2 months to help her treat prediabetes.  PMH significant for fibrocystic breast changes, osteoarthritis, and previous tobacco abuse. Patient is accompanied by her husband today.  Past Medical History:  Diagnosis Date   Fibrocystic breast    Herpes 10/13/1992   Osteoarthritis     Patient Active Problem List   Diagnosis Date Noted   Osteoporosis 01/07/2023   Primary hypertension 09/10/2022   Urinary incontinence 01/01/2022   Hyperlipidemia 07/07/2011   Tobacco abuse 06/19/2011   Arthritis 06/19/2011    Past Surgical History:  Procedure Laterality Date   ABDOMINAL HYSTERECTOMY  01/12/1995   fibroid, still has her ovaries.    BREAST EXCISIONAL BIOPSY Right 1989   Cyst on left ovary removed  10/13/1994   TONSILLECTOMY AND ADENOIDECTOMY  10/14/1955    OB History   No obstetric history on file.      Home Medications    Prior to Admission medications   Medication Sig Start Date End Date Taking? Authorizing Provider  atorvastatin (LIPITOR) 20 MG tablet TAKE 1 TABLET BY MOUTH EVERY DAY 11/17/22   Agapito Games, MD  losartan (COZAAR) 50 MG tablet Take 1 tablet (50 mg total) by mouth daily. 09/11/22   Agapito Games, MD    Family History History reviewed. No pertinent family history.  Social History Social History   Tobacco Use   Smoking status: Former    Packs/day: 1    Types: Cigarettes    Quit date: 12/30/2022    Years since quitting: 0.0   Smokeless tobacco: Never   Tobacco comments:    1 pack a day   Vaping Use   Vaping Use: Never used  Substance  Use Topics   Alcohol use: No   Drug use: No     Allergies   Dexilant [dexlansoprazole] and Cephalexin   Review of Systems Review of Systems  Neurological:  Positive for weakness.  All other systems reviewed and are negative.    Physical Exam Triage Vital Signs ED Triage Vitals [01/22/23 1141]  Enc Vitals Group     BP 112/78     Pulse Rate 72     Resp 16     Temp 97.6 F (36.4 C)     Temp Source Oral     SpO2 97 %     Weight      Height      Head Circumference      Peak Flow      Pain Score      Pain Loc      Pain Edu?      Excl. in GC?    No data found.  Updated Vital Signs BP 112/78 (BP Location: Left Arm)   Pulse 72   Temp 97.6 F (36.4 C) (Oral)   Resp 16   SpO2 97%    Physical Exam Vitals and nursing note reviewed.  Constitutional:      Appearance: Normal appearance. She is normal weight.  HENT:     Head: Normocephalic and atraumatic.     Right Ear: Tympanic  membrane, ear canal and external ear normal.     Left Ear: Tympanic membrane, ear canal and external ear normal.     Mouth/Throat:     Mouth: Mucous membranes are moist.     Pharynx: Oropharynx is clear.  Eyes:     Extraocular Movements: Extraocular movements intact.     Conjunctiva/sclera: Conjunctivae normal.     Pupils: Pupils are equal, round, and reactive to light.  Cardiovascular:     Rate and Rhythm: Normal rate and regular rhythm.     Pulses: Normal pulses.     Heart sounds: Normal heart sounds.  Pulmonary:     Effort: Pulmonary effort is normal.     Breath sounds: Normal breath sounds. No wheezing, rhonchi or rales.  Musculoskeletal:        General: Normal range of motion.     Cervical back: Normal range of motion and neck supple.  Skin:    General: Skin is warm and dry.  Neurological:     General: No focal deficit present.     Mental Status: She is alert and oriented to person, place, and time. Mental status is at baseline.      UC Treatments / Results  Labs (all  labs ordered are listed, but only abnormal results are displayed) Labs Reviewed - No data to display  EKG   Radiology No results found.  Procedures Procedures (including critical care time)  Medications Ordered in UC Medications - No data to display  Initial Impression / Assessment and Plan / UC Course  I have reviewed the triage vital signs and the nursing notes.  Pertinent labs & imaging results that were available during my care of the patient were reviewed by me and considered in my medical decision making (see chart for details).     MDM: 1.  Weakness-EKG revealed normal sinus rhythm-Advised patient EKG was within normal limits today.  Advised if symptoms worsen and/or unresolved please follow-up with PCP for further evaluation.  Patient discharged home, hemodynamically stable. Final Clinical Impressions(s) / UC Diagnoses   Final diagnoses:  Weakness     Discharge Instructions      Advised patient EKG was within normal limits today.  Advised if symptoms worsen and/or unresolved please follow-up with PCP for further evaluation.     ED Prescriptions   None    PDMP not reviewed this encounter.   Trevor Iha, FNP 01/22/23 1312

## 2023-01-22 NOTE — Discharge Instructions (Addendum)
Advised patient EKG was within normal limits today.  Advised if symptoms worsen and/or unresolved please follow-up with PCP for further evaluation.

## 2023-01-22 NOTE — ED Triage Notes (Signed)
Had blood drawn Wednesday. Has felt weak, chills, near syncopal, and fatigued in waves since. Also has been following a special diet over the last two months where she's lost 8 pounds to help treat her pre-diabetes. Denies fever, change in appetite, chest pain, SOB, palpitations, abdominal pain. Reports some mild nausea. 24 hour food recall includes buttered toast, rice with broccoli, almonds, cottage cheese, and fruits.

## 2023-01-25 ENCOUNTER — Encounter: Payer: Self-pay | Admitting: Family Medicine

## 2023-01-25 DIAGNOSIS — I10 Essential (primary) hypertension: Secondary | ICD-10-CM

## 2023-01-28 DIAGNOSIS — H52223 Regular astigmatism, bilateral: Secondary | ICD-10-CM | POA: Diagnosis not present

## 2023-01-28 DIAGNOSIS — H25813 Combined forms of age-related cataract, bilateral: Secondary | ICD-10-CM | POA: Diagnosis not present

## 2023-01-28 MED ORDER — LOSARTAN POTASSIUM 25 MG PO TABS: 25.0000 mg | ORAL_TABLET | Freq: Every day | ORAL | 1 refills | Status: DC

## 2023-02-03 DIAGNOSIS — H25812 Combined forms of age-related cataract, left eye: Secondary | ICD-10-CM | POA: Diagnosis not present

## 2023-02-03 DIAGNOSIS — Z881 Allergy status to other antibiotic agents status: Secondary | ICD-10-CM | POA: Diagnosis not present

## 2023-02-03 DIAGNOSIS — Z79899 Other long term (current) drug therapy: Secondary | ICD-10-CM | POA: Diagnosis not present

## 2023-02-03 DIAGNOSIS — R7303 Prediabetes: Secondary | ICD-10-CM | POA: Diagnosis not present

## 2023-02-03 DIAGNOSIS — H25813 Combined forms of age-related cataract, bilateral: Secondary | ICD-10-CM | POA: Diagnosis not present

## 2023-02-03 DIAGNOSIS — H52223 Regular astigmatism, bilateral: Secondary | ICD-10-CM | POA: Diagnosis not present

## 2023-02-03 DIAGNOSIS — Z87891 Personal history of nicotine dependence: Secondary | ICD-10-CM | POA: Diagnosis not present

## 2023-02-03 DIAGNOSIS — I10 Essential (primary) hypertension: Secondary | ICD-10-CM | POA: Diagnosis not present

## 2023-02-11 ENCOUNTER — Other Ambulatory Visit: Payer: Medicare Other

## 2023-02-12 DIAGNOSIS — I10 Essential (primary) hypertension: Secondary | ICD-10-CM | POA: Diagnosis not present

## 2023-02-12 DIAGNOSIS — Z87891 Personal history of nicotine dependence: Secondary | ICD-10-CM | POA: Diagnosis not present

## 2023-02-12 DIAGNOSIS — H25812 Combined forms of age-related cataract, left eye: Secondary | ICD-10-CM | POA: Diagnosis not present

## 2023-02-12 DIAGNOSIS — H25813 Combined forms of age-related cataract, bilateral: Secondary | ICD-10-CM | POA: Diagnosis not present

## 2023-02-12 DIAGNOSIS — H52223 Regular astigmatism, bilateral: Secondary | ICD-10-CM | POA: Diagnosis not present

## 2023-02-12 DIAGNOSIS — H25811 Combined forms of age-related cataract, right eye: Secondary | ICD-10-CM | POA: Insufficient documentation

## 2023-02-13 ENCOUNTER — Other Ambulatory Visit: Payer: Self-pay | Admitting: Family Medicine

## 2023-02-13 DIAGNOSIS — E785 Hyperlipidemia, unspecified: Secondary | ICD-10-CM

## 2023-02-16 ENCOUNTER — Other Ambulatory Visit: Payer: Medicare Other

## 2023-02-16 DIAGNOSIS — D582 Other hemoglobinopathies: Secondary | ICD-10-CM | POA: Diagnosis not present

## 2023-02-16 DIAGNOSIS — R195 Other fecal abnormalities: Secondary | ICD-10-CM | POA: Diagnosis not present

## 2023-02-16 DIAGNOSIS — I10 Essential (primary) hypertension: Secondary | ICD-10-CM | POA: Diagnosis not present

## 2023-02-17 LAB — COMPLETE METABOLIC PANEL WITH GFR
AG Ratio: 1.6 (calc) (ref 1.0–2.5)
ALT: 10 U/L (ref 6–29)
AST: 13 U/L (ref 10–35)
Albumin: 4.1 g/dL (ref 3.6–5.1)
Alkaline phosphatase (APISO): 73 U/L (ref 37–153)
BUN: 16 mg/dL (ref 7–25)
CO2: 26 mmol/L (ref 20–32)
Calcium: 10.4 mg/dL (ref 8.6–10.4)
Chloride: 103 mmol/L (ref 98–110)
Creat: 0.92 mg/dL (ref 0.60–1.00)
Globulin: 2.5 g/dL (calc) (ref 1.9–3.7)
Glucose, Bld: 90 mg/dL (ref 65–99)
Potassium: 4.8 mmol/L (ref 3.5–5.3)
Sodium: 137 mmol/L (ref 135–146)
Total Bilirubin: 0.5 mg/dL (ref 0.2–1.2)
Total Protein: 6.6 g/dL (ref 6.1–8.1)
eGFR: 66 mL/min/{1.73_m2} (ref >=60)

## 2023-02-17 LAB — CBC WITH DIFFERENTIAL/PLATELET
Absolute Monocytes: 1082 cells/uL — ABNORMAL HIGH (ref 200–950)
Basophils Absolute: 107 cells/uL (ref 0–200)
Basophils Relative: 1.3 %
Eosinophils Absolute: 221 cells/uL (ref 15–500)
Eosinophils Relative: 2.7 %
HCT: 46.5 % — ABNORMAL HIGH (ref 35.0–45.0)
Hemoglobin: 15.4 g/dL (ref 11.7–15.5)
Lymphs Abs: 2837 cells/uL (ref 850–3900)
MCH: 30.7 pg (ref 27.0–33.0)
MCHC: 33.1 g/dL (ref 32.0–36.0)
MCV: 92.6 fL (ref 80.0–100.0)
MPV: 9.7 fL (ref 7.5–12.5)
Monocytes Relative: 13.2 %
Neutro Abs: 3952 cells/uL (ref 1500–7800)
Neutrophils Relative %: 48.2 %
Platelets: 298 10*3/uL (ref 140–400)
RBC: 5.02 10*6/uL (ref 3.80–5.10)
RDW: 12.4 % (ref 11.0–15.0)
Total Lymphocyte: 34.6 %
WBC: 8.2 10*3/uL (ref 3.8–10.8)

## 2023-02-17 LAB — IRON,TIBC AND FERRITIN PANEL
%SAT: 22 % (calc) (ref 16–45)
Ferritin: 24 ng/mL (ref 16–288)
Iron: 74 ug/dL (ref 45–160)
TIBC: 339 mcg/dL (calc) (ref 250–450)

## 2023-02-17 LAB — HEMOGLOBIN A1C
Hgb A1c MFr Bld: 5.4 % of total Hgb (ref <5.7)
Mean Plasma Glucose: 108 mg/dL
eAG (mmol/L): 6 mmol/L

## 2023-02-17 NOTE — Progress Notes (Signed)
Hi Alison Wilson,  Hemoglobin looks better and it is down to 15.4 which is awesome!  This is back into the normal range and the HCT which is the hematocrit is also still trending down.  Your A1c has improved greatly and is all the way back down into the normal range at 5.4.  Your calcium also is normal.  Liver function looks great.  Your total iron and ferritin have dropped.  I just do not want them going to low.  We usually like your ferritin to be above 40.  I know you had been restricting some extra iron foods but I would like for you to start eating them again.  You do not have any evidence of iron overload and your test results came back negative for hemochromatosis.

## 2023-02-18 ENCOUNTER — Encounter: Payer: Self-pay | Admitting: Family Medicine

## 2023-02-18 NOTE — Telephone Encounter (Signed)
I would not recommend any treatment as she has no other indication of an infection.  Total white blood cells are normal so I do not think that this is significant at this time.

## 2023-02-25 DIAGNOSIS — R195 Other fecal abnormalities: Secondary | ICD-10-CM | POA: Diagnosis not present

## 2023-03-04 ENCOUNTER — Ambulatory Visit (INDEPENDENT_AMBULATORY_CARE_PROVIDER_SITE_OTHER): Payer: Medicare Other

## 2023-03-04 DIAGNOSIS — Z1231 Encounter for screening mammogram for malignant neoplasm of breast: Secondary | ICD-10-CM | POA: Diagnosis not present

## 2023-03-04 DIAGNOSIS — Z0000 Encounter for general adult medical examination without abnormal findings: Secondary | ICD-10-CM

## 2023-03-05 NOTE — Progress Notes (Signed)
Please call patient. Normal mammogram.  Repeat in 1 year.  

## 2023-03-07 ENCOUNTER — Other Ambulatory Visit: Payer: Self-pay | Admitting: Family Medicine

## 2023-03-07 DIAGNOSIS — I10 Essential (primary) hypertension: Secondary | ICD-10-CM

## 2023-03-12 ENCOUNTER — Ambulatory Visit: Payer: Medicare Other | Admitting: Family Medicine

## 2023-03-12 DIAGNOSIS — Z1211 Encounter for screening for malignant neoplasm of colon: Secondary | ICD-10-CM | POA: Diagnosis not present

## 2023-03-12 DIAGNOSIS — D123 Benign neoplasm of transverse colon: Secondary | ICD-10-CM | POA: Diagnosis not present

## 2023-03-12 DIAGNOSIS — R195 Other fecal abnormalities: Secondary | ICD-10-CM | POA: Diagnosis not present

## 2023-03-12 DIAGNOSIS — D124 Benign neoplasm of descending colon: Secondary | ICD-10-CM | POA: Diagnosis not present

## 2023-03-12 LAB — HM COLONOSCOPY

## 2023-03-17 ENCOUNTER — Encounter: Payer: Self-pay | Admitting: Family Medicine

## 2023-04-06 ENCOUNTER — Ambulatory Visit: Payer: Medicare Other | Admitting: Family Medicine

## 2023-04-07 DIAGNOSIS — Z888 Allergy status to other drugs, medicaments and biological substances status: Secondary | ICD-10-CM | POA: Diagnosis not present

## 2023-04-07 DIAGNOSIS — M1990 Unspecified osteoarthritis, unspecified site: Secondary | ICD-10-CM | POA: Diagnosis not present

## 2023-04-07 DIAGNOSIS — K5732 Diverticulitis of large intestine without perforation or abscess without bleeding: Secondary | ICD-10-CM | POA: Diagnosis not present

## 2023-04-07 DIAGNOSIS — R160 Hepatomegaly, not elsewhere classified: Secondary | ICD-10-CM | POA: Diagnosis not present

## 2023-04-07 DIAGNOSIS — I708 Atherosclerosis of other arteries: Secondary | ICD-10-CM | POA: Diagnosis not present

## 2023-04-07 DIAGNOSIS — F17210 Nicotine dependence, cigarettes, uncomplicated: Secondary | ICD-10-CM | POA: Diagnosis not present

## 2023-04-07 DIAGNOSIS — I10 Essential (primary) hypertension: Secondary | ICD-10-CM | POA: Diagnosis not present

## 2023-04-07 DIAGNOSIS — K3189 Other diseases of stomach and duodenum: Secondary | ICD-10-CM | POA: Diagnosis not present

## 2023-04-07 DIAGNOSIS — Z79899 Other long term (current) drug therapy: Secondary | ICD-10-CM | POA: Diagnosis not present

## 2023-04-07 DIAGNOSIS — Z881 Allergy status to other antibiotic agents status: Secondary | ICD-10-CM | POA: Diagnosis not present

## 2023-04-07 DIAGNOSIS — K5730 Diverticulosis of large intestine without perforation or abscess without bleeding: Secondary | ICD-10-CM | POA: Diagnosis not present

## 2023-04-08 ENCOUNTER — Ambulatory Visit (INDEPENDENT_AMBULATORY_CARE_PROVIDER_SITE_OTHER): Payer: Medicare Other | Admitting: Family Medicine

## 2023-04-08 ENCOUNTER — Encounter: Payer: Self-pay | Admitting: Family Medicine

## 2023-04-08 VITALS — BP 123/79 | HR 76 | Temp 98.5°F | Ht 66.0 in | Wt 114.0 lb

## 2023-04-08 DIAGNOSIS — K5792 Diverticulitis of intestine, part unspecified, without perforation or abscess without bleeding: Secondary | ICD-10-CM

## 2023-04-08 DIAGNOSIS — R110 Nausea: Secondary | ICD-10-CM

## 2023-04-08 MED ORDER — ONDANSETRON HCL 4 MG PO TABS: 4.0000 mg | ORAL_TABLET | Freq: Four times a day (QID) | ORAL | 0 refills | Status: DC | PRN

## 2023-04-08 NOTE — Progress Notes (Signed)
   Established Patient Office Visit  Subjective   Patient ID: Alison Wilson, female    DOB: 22-Jun-1951  Age: 72 y.o. MRN: [id number]  Chief Complaint  Patient presents with   Umbilical Hernia    HPI     ROS    Objective:     BP 123/79   Pulse 76   Temp 98.5 F (36.9 C)   Ht 5\' 6"  (1.676 m)   Wt 114 lb (51.7 kg)   SpO2 97%   BMI 18.40 kg/m    Physical Exam   No results found for any visits on 04/08/23.    The 10-year ASCVD risk score (Arnett DK, et al., 2019) is: 22.7%    Assessment & Plan:   Problem List Items Addressed This Visit   None Visit Diagnoses     Nausea    -  Primary   Relevant Medications   ondansetron (ZOFRAN) 4 MG tablet   Diverticulitis           No follow-ups on file.    Nani Gasser, MD

## 2023-04-08 NOTE — Progress Notes (Signed)
   Established Patient Office Visit  Subjective   Patient ID: Alison Wilson, female    DOB: 1951/06/04  Age: 71 y.o. MRN: [id number]  Chief Complaint  Patient presents with   Umbilical Hernia    HPI Pt reports that she was helping her husband with some yard work which involved some heavy lifting of tree limbs.    She stated that a few weeks prior she had a colonoscopy done which she had 11 polyps removed and those were benign.    So when this happened she believes that while lifting the tree limbs onto the bed of the truck they were rubbing against her abdomen and caused some pain. She eventually was seen at [facility] and was told that she had diverticulitis and was started on 2 antibiotics Cipro 500 mg x 10 days bid and Flagyl 500 mg x 10 days bid she states that the ABX causes her to feel nauseated and has been taking Zofran that was given by Dr. Gwenevere Abbot and would like a refill.  She started the abx yesterday and she is feeling better today. Still some bloating and tenderness.  Has adjusted her diet.    White blood cell count was 11.2.    ROS    Objective:     BP 123/79   Pulse 76   Temp 98.5 F (36.9 C)   Ht 5\' 6"  (1.676 m)   Wt 114 lb (51.7 kg)   SpO2 97%   BMI 18.40 kg/m    Physical Exam Vitals and nursing note reviewed.  Constitutional:      Appearance: She is well-developed.  HENT:     Head: Normocephalic and atraumatic.  Cardiovascular:     Rate and Rhythm: Normal rate and regular rhythm.     Heart sounds: Normal heart sounds.  Pulmonary:     Effort: Pulmonary effort is normal.     Breath sounds: Normal breath sounds.  Abdominal:     General: Bowel sounds are normal.     Palpations: Abdomen is soft.     Tenderness: There is abdominal tenderness.     Comments: Diffuse tenderness  Skin:    General: Skin is warm and dry.  Neurological:     Mental Status: She is alert and oriented to person, place, and time.  Psychiatric:        Behavior: Behavior normal.       No results found for any visits on 04/08/23.    The 10-year ASCVD risk score (Arnett DK, et al., 2019) is: 22.7%    Assessment & Plan:   Problem List Items Addressed This Visit   None Visit Diagnoses     Nausea    -  Primary   Relevant Medications   ondansetron (ZOFRAN) 4 MG tablet   Diverticulitis           Diverticulitis - continue an complete abx treatment. Call if not continuing to improve.  She already feels somewhat better today which is fantastic that she is still quite diffusely tender on exam.  Refilled Zofran.    ED notes and labs reviewed.  CT noted extensive colonic diverticulosis.  They also saw what looked like a mild mid sigmoid acute diverticulitis.  No follow-ups on file.    Nani Gasser, MD

## 2023-04-08 NOTE — Progress Notes (Signed)
Pt reports that she was helping her husband with some yard work which involved some heavy lifting of tree limbs.   She stated that a few weeks prior she had a colonoscopy done which she had 11 polyps removed and those were benign.   So when this happened she believes that while lifting the tree limbs onto the bed of the truck they were rubbing against her abdomen and caused some pain. She eventually was seen at [facility] and was told that she had diverticulitis and was started on 2 antibiotics Cipro 500 mg x 10 days bid and Flagyl 500 mg x 10 days bid she states that the ABX causes her to feel nauseated and has been taking Zofran that was given by Dr. Gwenevere Abbot and would like a refill

## 2023-04-13 ENCOUNTER — Encounter: Payer: Self-pay | Admitting: Family Medicine

## 2023-04-13 ENCOUNTER — Ambulatory Visit (INDEPENDENT_AMBULATORY_CARE_PROVIDER_SITE_OTHER): Payer: Medicare Other | Admitting: Family Medicine

## 2023-04-13 VITALS — BP 131/83 | HR 79 | Resp 18 | Ht 66.0 in | Wt 111.8 lb

## 2023-04-13 DIAGNOSIS — B370 Candidal stomatitis: Secondary | ICD-10-CM | POA: Insufficient documentation

## 2023-04-13 DIAGNOSIS — K5792 Diverticulitis of intestine, part unspecified, without perforation or abscess without bleeding: Secondary | ICD-10-CM

## 2023-04-13 DIAGNOSIS — T7840XA Allergy, unspecified, initial encounter: Secondary | ICD-10-CM | POA: Diagnosis not present

## 2023-04-13 MED ORDER — NYSTATIN 100000 UNIT/ML MT SUSP: 5.0000 mL | Freq: Four times a day (QID) | OROMUCOSAL | 0 refills | Status: AC

## 2023-04-13 NOTE — Assessment & Plan Note (Signed)
-   pt had questions that were addressed about diverticulitis and how she got it along with what foods to eat - discussed she call back GI to discuss another medication (likely augmentin) that she needs to be switched to because she does not note symptom improvement  - also discussed needing a colonoscopy by GI in a few weeks post diverticulitis. Pt said she had one about a month ago

## 2023-04-13 NOTE — Progress Notes (Signed)
Acute Office Visit  Subjective:     Patient ID: Alison Wilson, female    DOB: 1951/09/18, 72 y.o.   MRN: [id number]  Chief Complaint  Patient presents with   Medication Reaction    HPI Patient is in today for concerns of allergic reaction to cipro/flagyl for the treatment of diverticulitis. Pt started noticing a feeling of throat swelling and tongue swelling this morning. She had been on the medication for about 5 days at this point. She did call her GI and left a message for their office. She does say within the last hour she has noted some improvement.   Review of Systems  Constitutional:  Negative for chills and fever.  HENT:         Throat pain  Respiratory:  Negative for cough and shortness of breath.   Cardiovascular:  Negative for chest pain.  Neurological:  Negative for headaches.        Objective:    BP 131/83 (BP Location: Left Arm, Patient Position: Sitting, Cuff Size: Small)   Pulse 79   Resp 18   Ht 5\' 6"  (1.676 m)   Wt 111 lb 12 oz (50.7 kg)   SpO2 98%   BMI 18.04 kg/m    Physical Exam Vitals and nursing note reviewed.  Constitutional:      General: She is not in acute distress.    Appearance: Normal appearance.  HENT:     Head: Normocephalic and atraumatic.     Right Ear: External ear normal.     Left Ear: External ear normal.     Nose: Nose normal.     Mouth/Throat:     Comments: Clear throat no throat or tonsillar swelling - white plaque on tongue Eyes:     Conjunctiva/sclera: Conjunctivae normal.  Cardiovascular:     Rate and Rhythm: Normal rate and regular rhythm.  Pulmonary:     Effort: Pulmonary effort is normal.     Breath sounds: Normal breath sounds.  Neurological:     General: No focal deficit present.     Mental Status: She is alert and oriented to person, place, and time.  Psychiatric:        Mood and Affect: Mood normal.        Behavior: Behavior normal.        Thought Content: Thought content normal.        Judgment:  Judgment normal.     No results found for any visits on 04/13/23.      Assessment & Plan:   Problem List Items Addressed This Visit       Digestive   Oral candidiasis - Primary    - discussed that it looks like medication caused thrush--have sent in treatment nystatin swish and swallow       Relevant Medications   nystatin (MYCOSTATIN) 100000 UNIT/ML suspension     Other   Allergic reaction    - pt given benadryl 12.5mg  in clinic as well as famotidine 20mg  - no airway obstruction or wheezing noted on exam today. Like reaction is thrush. Pt has improved throughout the day today - given ED precautions  - recommended taking another 12.5mg  tablet tonight       Diverticulitis    - pt had questions that were addressed about diverticulitis and how she got it along with what foods to eat - discussed she call back GI to discuss another medication (likely augmentin) that she needs to be switched to because she does not  note symptom improvement  - also discussed needing a colonoscopy by GI in a few weeks post diverticulitis. Pt said she had one about a month ago       Meds ordered this encounter  Medications   nystatin (MYCOSTATIN) 100000 UNIT/ML suspension    Sig: Take 5 mLs (500,000 Units total) by mouth 4 (four) times daily for 7 days. Swish and spit by mouth 4 times daily for 7 days    Dispense:  60 mL    Refill:  0  60 minutes were spent with the patient   No follow-ups on file.  Charlton Amor, DO

## 2023-04-13 NOTE — Assessment & Plan Note (Signed)
-   discussed that it looks like medication caused thrush--have sent in treatment nystatin swish and swallow

## 2023-04-13 NOTE — Assessment & Plan Note (Signed)
-   pt given benadryl 12.5mg  in clinic as well as famotidine 20mg  - no airway obstruction or wheezing noted on exam today. Like reaction is thrush. Pt has improved throughout the day today - given ED precautions  - recommended taking another 12.5mg  tablet tonight

## 2023-04-13 NOTE — Patient Instructions (Addendum)
In clinic you were given  12.5mg  of benadryl and 20mg  of famotidine   At 9pm tonight take the other 12.5mg  of benadryl  You will take the nystatin swish and swallow for 7 days  Check with GI to see about taking another antibiotic

## 2023-04-21 DIAGNOSIS — K5792 Diverticulitis of intestine, part unspecified, without perforation or abscess without bleeding: Secondary | ICD-10-CM | POA: Diagnosis not present

## 2023-04-21 DIAGNOSIS — R634 Abnormal weight loss: Secondary | ICD-10-CM | POA: Diagnosis not present

## 2023-04-21 DIAGNOSIS — K219 Gastro-esophageal reflux disease without esophagitis: Secondary | ICD-10-CM | POA: Diagnosis not present

## 2023-04-28 ENCOUNTER — Ambulatory Visit: Payer: Medicare Other | Admitting: Family Medicine

## 2023-05-07 ENCOUNTER — Encounter: Payer: Self-pay | Admitting: Emergency Medicine

## 2023-05-11 ENCOUNTER — Ambulatory Visit (INDEPENDENT_AMBULATORY_CARE_PROVIDER_SITE_OTHER): Payer: Medicare Other | Admitting: Family Medicine

## 2023-05-11 ENCOUNTER — Encounter: Payer: Self-pay | Admitting: Family Medicine

## 2023-05-11 VITALS — BP 137/85 | HR 72 | Ht 66.0 in | Wt 107.1 lb

## 2023-05-11 DIAGNOSIS — R636 Underweight: Secondary | ICD-10-CM | POA: Diagnosis not present

## 2023-05-11 DIAGNOSIS — K2100 Gastro-esophageal reflux disease with esophagitis, without bleeding: Secondary | ICD-10-CM

## 2023-05-11 DIAGNOSIS — R634 Abnormal weight loss: Secondary | ICD-10-CM

## 2023-05-11 NOTE — Progress Notes (Addendum)
Established Patient Office Visit  Subjective   Patient ID: Alison Wilson, female    DOB: [birth year]  Age: 72 y.o. MRN: [id number]  No chief complaint on file.   HPI  Was seen in the emergency room on June 25, about 4 weeks ago for abdominal pain after doing some heavy yard work.  She had just had a colonoscopy on May 30.  She was diagnosed with diverticulitis based on CT of the abdomen pelvis that was performed during the ED visit on 6/25.  I saw her back for follow-up the next day she was on her antibiotic treatment and we had refilled her Zofran. Saw GI, Dr. Gwenevere Abbot on 7/9 for follow up.  They rec EGD next month if not improving.  She has lost about 3 lbs. she started keeping track of her dietary intake she has been trying to stick with a low sugar diet since she is prediabetic as well as sticking with diet for diverticulitis.  She has been mostly just eating chicken and fish.  Started taking famotidine 20 mg twice a day about 3 weeks ago she has been pretty consistent with it but is still having some breakthrough reflux symptoms.   ROS    Objective:     BP 137/85   Pulse 72   Ht 5\' 6"  (1.676 m)   Wt 107 lb 1.9 oz (48.6 kg)   SpO2 98%   BMI 17.29 kg/m    Physical Exam Vitals and nursing note reviewed.  Constitutional:      Appearance: She is well-developed.  HENT:     Head: Normocephalic and atraumatic.  Cardiovascular:     Rate and Rhythm: Normal rate and regular rhythm.     Heart sounds: Normal heart sounds.  Pulmonary:     Effort: Pulmonary effort is normal.     Breath sounds: Normal breath sounds.  Skin:    General: Skin is warm and dry.  Neurological:     Mental Status: She is alert and oriented to person, place, and time.  Psychiatric:        Behavior: Behavior normal.      No results found for any visits on 05/11/23.    The 10-year ASCVD risk score (Arnett DK, et al., 2019) is: 27.4%    Assessment & Plan:   Problem List Items Addressed This Visit    None Visit Diagnoses     Gastroesophageal reflux disease with esophagitis without hemorrhage    -  Primary   Relevant Orders   CMP14+EGFR   B12   CBC w/Diff/Platelet   Fe+TIBC+Fer   Magnesium   PTH, intact and calcium   TSH   Vitamin B1   Vitamin B6   Abnormal weight loss       Relevant Orders   CMP14+EGFR   B12   CBC w/Diff/Platelet   Fe+TIBC+Fer   Magnesium   PTH, intact and calcium   TSH   Vitamin B1   Vitamin B6   Underweight       Relevant Orders   CMP14+EGFR   B12   CBC w/Diff/Platelet   Fe+TIBC+Fer   Magnesium   PTH, intact and calcium   TSH   Vitamin B1   Vitamin B6      Underweight-we did discuss checking her thyroid to make sure she is not hyperthyroid.  I also like to check her PTH as her calcium levels have actually been a little elevated lately.  She is not sure, calories a day she  is taking and but I would really like for her to shoot to close to 1500.  We discussed using a smart phone app to help track that.  I also like to check for mineral deficiencies.  Fatigue-her husband notes that she just gets really fatigued very easily.  Even after just 15 minutes at the grocery store she has to go sit in the truck and rest.  She is to be able to shop for hours without difficulty.  She denies any shortness of breath or chest discomfort as the cause of the fatigue.  GERD-breakthrough symptoms even on H2 blocker.  We discussed the fact that she does have a scope on Friday they may even consider switching her to a PPI at that point especially since it sounds like her symptoms are not well-controlled she may have an underlying ulcer etc. that could help explain some of her symptoms.  Return if symptoms worsen or fail to improve.    Nani Gasser, MD

## 2023-05-11 NOTE — Patient Instructions (Signed)
Try to get in about 1500 calories per day.   Can use MyFitness Pal to track calories.

## 2023-05-13 ENCOUNTER — Other Ambulatory Visit: Payer: Self-pay | Admitting: Family Medicine

## 2023-05-13 ENCOUNTER — Encounter: Payer: Self-pay | Admitting: Family Medicine

## 2023-05-13 DIAGNOSIS — I10 Essential (primary) hypertension: Secondary | ICD-10-CM

## 2023-05-13 DIAGNOSIS — E785 Hyperlipidemia, unspecified: Secondary | ICD-10-CM

## 2023-05-13 DIAGNOSIS — Z5181 Encounter for therapeutic drug level monitoring: Secondary | ICD-10-CM

## 2023-05-13 NOTE — Telephone Encounter (Signed)
Lipid panel ordered.  I still want to keep a tight eye on that calcium and try to recheck maybe in 3 weeks.  Because if it still high I really would like to get an endocrinologist to help [place] figure out why.  We do not typically see these levels in people who are just eating calcium rich diets.

## 2023-05-13 NOTE — Progress Notes (Signed)
Hi Alison Wilson, kidney function and liver function look great.  That calcium level went back up again.  I would like to refer you to an endocrinologist to see if they can help [place] figure out why.  If you are okay with that then please let me know.  Hemoglobin looks good at 15.9.  I disorder make sure it does not continue to rise so we will keep an eye on that.  Your vitamin B12 is in the normal range but it is a little bit on the lower end.  You might really benefit from just a good One-A-Day multivitamin if you are not already taking 1.  Your iron stores look better so your ferritin was 24 and it is now up to 64 which is fantastic!  Your magnesium looks normal.  Parathyroid hormone level looks normal.  Thyroid looks great.  Thiamine, B6 are still pending.

## 2023-05-14 NOTE — Telephone Encounter (Signed)
Patient states she  wanted to know if she should change her cholesterol medication - she states she read where it contains calcium as an ingredient.  She also wanted to know what she can do to lower her calcium other than changing her diet before the next lab draw. She states her sodium was low and was wanting to know if you felt that increasing sodium rich foods might help with her calcium level.

## 2023-05-15 DIAGNOSIS — K2970 Gastritis, unspecified, without bleeding: Secondary | ICD-10-CM | POA: Diagnosis not present

## 2023-05-15 DIAGNOSIS — K449 Diaphragmatic hernia without obstruction or gangrene: Secondary | ICD-10-CM | POA: Diagnosis not present

## 2023-05-15 DIAGNOSIS — R634 Abnormal weight loss: Secondary | ICD-10-CM | POA: Diagnosis not present

## 2023-05-15 DIAGNOSIS — K317 Polyp of stomach and duodenum: Secondary | ICD-10-CM | POA: Diagnosis not present

## 2023-05-15 NOTE — Telephone Encounter (Signed)
Orders Placed This Encounter  Procedures   Lipid panel   Ambulatory referral to Endocrinology    Referral Priority:   Routine    Referral Type:   Consultation    Referral Reason:   Specialty Services Required    Number of Visits Requested:   1

## 2023-05-15 NOTE — Progress Notes (Signed)
Hi Kimeka, thiamine and B6 are normal.

## 2023-05-21 DIAGNOSIS — I10 Essential (primary) hypertension: Secondary | ICD-10-CM | POA: Diagnosis not present

## 2023-05-22 ENCOUNTER — Encounter: Payer: Self-pay | Admitting: Family Medicine

## 2023-05-22 NOTE — Progress Notes (Signed)
Your lab work is within acceptable range and there are no concerning findings.   ?

## 2023-05-25 NOTE — Telephone Encounter (Signed)
OK for appt. WE can't do Tdap here. She has medicare so will have to get done at the pharmacy.

## 2023-06-05 ENCOUNTER — Ambulatory Visit (INDEPENDENT_AMBULATORY_CARE_PROVIDER_SITE_OTHER): Payer: Medicare Other | Admitting: Family Medicine

## 2023-06-05 ENCOUNTER — Encounter: Payer: Self-pay | Admitting: Family Medicine

## 2023-06-05 DIAGNOSIS — Z8719 Personal history of other diseases of the digestive system: Secondary | ICD-10-CM | POA: Diagnosis not present

## 2023-06-05 DIAGNOSIS — R7989 Other specified abnormal findings of blood chemistry: Secondary | ICD-10-CM

## 2023-06-05 NOTE — Progress Notes (Signed)
Established Patient Office Visit  Subjective   Patient ID: Alison Wilson, female    DOB: April 29, 1951  Age: 72 y.o. MRN: [id number]  No chief complaint on file.   HPI  She is doing well. She is gaining some weight.    She did want to talkg about a few concerns.    Her GERD is much better. She had her endoscopy and everything came back reassuring. She is not on a PPI and is doing better.  Did 3 biopsies and everything came back normal.  We will call to try to get a copy of the report we can see the date through care everywhere but not the actual report.  The need to get an updated copy of her colonoscopy from May.  Recent labs showed B12 on the lowe end of normal. She has been trying to eat more b12 rich foods.    She calcium level aslo went back up so we have referred her to endocrinology this time.  PTH was normal.     The 10-year ASCVD risk score (Arnett DK, et al., 2019) is: 25.4%   Values used to calculate the score:     Age: 82 years     Sex: Female     Is Non-Hispanic African American: No     Diabetic: No     Tobacco smoker: Yes     Systolic Blood Pressure: 133 mmHg     Is BP treated: Yes     HDL Cholesterol: 65 mg/dL     Total Cholesterol: 146 mg/dL     ROS    Objective:     BP 133/76   Pulse 78   Ht 5\' 6"  (1.676 m)   Wt 116 lb (52.6 kg)   SpO2 97%   BMI 18.72 kg/m    Physical Exam Vitals reviewed.  Constitutional:      Appearance: Normal appearance.  HENT:     Head: Normocephalic.  Pulmonary:     Effort: Pulmonary effort is normal.  Neurological:     Mental Status: She is alert and oriented to person, place, and time.  Psychiatric:        Mood and Affect: Mood normal.        Behavior: Behavior normal.      No results found for any visits on 06/05/23.    The 10-year ASCVD risk score (Arnett DK, et al., 2019) is: 25.4%    Assessment & Plan:   Problem List Items Addressed This Visit       Other   Serum calcium elevated - Primary     Still have a clear reason for her to have elevated serum calcium levels.  She does not have a very high dietary intake of calcium and does not take any supplements so we have already placed a referral to endocrine for further help.  Parathyroid level was normal.      Low vitamin B12 level    She is currently supplementing through her diet and not a pill or medication.  I would like to recheck those levels in 2 to 3 months.      History of acute gastritis    Gastritis is so much better.  She is off of medication and symptoms actually seem to be well-controlled which is fantastic.       No follow-ups on file.   I spent 25 minutes on the day of the encounter to include pre-visit record review, face-to-face time with the patient and post  visit ordering of test.   Nani Gasser, MD

## 2023-06-05 NOTE — Assessment & Plan Note (Signed)
She is currently supplementing through her diet and not a pill or medication.  I would like to recheck those levels in 2 to 3 months.

## 2023-06-05 NOTE — Assessment & Plan Note (Signed)
Gastritis is so much better.  She is off of medication and symptoms actually seem to be well-controlled which is fantastic.

## 2023-06-05 NOTE — Assessment & Plan Note (Signed)
Still have a clear reason for her to have elevated serum calcium levels.  She does not have a very high dietary intake of calcium and does not take any supplements so we have already placed a referral to endocrine for further help.  Parathyroid level was normal.

## 2023-06-05 NOTE — Patient Instructions (Signed)
Please get your Tdap ( tetanus) vaccine at your local pharmacy.

## 2023-06-11 ENCOUNTER — Ambulatory Visit (INDEPENDENT_AMBULATORY_CARE_PROVIDER_SITE_OTHER): Payer: Medicare Other | Admitting: Family Medicine

## 2023-06-11 ENCOUNTER — Encounter: Payer: Self-pay | Admitting: Family Medicine

## 2023-06-11 VITALS — BP 149/84 | HR 70 | Ht 66.0 in | Wt 113.8 lb

## 2023-06-11 DIAGNOSIS — B9689 Other specified bacterial agents as the cause of diseases classified elsewhere: Secondary | ICD-10-CM | POA: Diagnosis not present

## 2023-06-11 DIAGNOSIS — J3489 Other specified disorders of nose and nasal sinuses: Secondary | ICD-10-CM

## 2023-06-11 DIAGNOSIS — H9202 Otalgia, left ear: Secondary | ICD-10-CM | POA: Diagnosis not present

## 2023-06-11 DIAGNOSIS — J019 Acute sinusitis, unspecified: Secondary | ICD-10-CM | POA: Diagnosis not present

## 2023-06-11 LAB — POCT INFLUENZA A/B
Influenza A, POC: NEGATIVE
Influenza B, POC: NEGATIVE

## 2023-06-11 LAB — POC COVID19 BINAXNOW: SARS Coronavirus 2 Ag: NEGATIVE

## 2023-06-11 MED ORDER — DOXYCYCLINE HYCLATE 100 MG PO TABS: 100.0000 mg | ORAL_TABLET | Freq: Two times a day (BID) | ORAL | 0 refills | Status: AC

## 2023-06-11 MED ORDER — METHYLPREDNISOLONE 4 MG PO TBPK: ORAL_TABLET | ORAL | 0 refills | Status: DC

## 2023-06-11 NOTE — Progress Notes (Signed)
Established patient visit   Patient: Alison Wilson   DOB: 04/16/1951   72 y.o. Female  MRN: [id number] Visit Date: 06/11/2023  Today's healthcare provider: Charlton Amor, DO   Chief Complaint  Patient presents with   Sinusitis    Pt comes in today with complaints of sinus like symptoms, pressure behind eyes, runny nose, and left ear pain x3days    SUBJECTIVE    Chief Complaint  Patient presents with   Sinusitis    Pt comes in today with complaints of sinus like symptoms, pressure behind eyes, runny nose, and left ear pain x3days   HPI HPI     Sinusitis    Additional comments: Pt comes in today with complaints of sinus like symptoms, pressure behind eyes, runny nose, and left ear pain x3days      Last edited by Roselyn Reef, CMA on 06/11/2023  1:33 PM.      Pt presents for concerns about a sinus infection. Has sinus pressure across maxillary sinuses bilaterally. Also admits to tender lymph nodes. Says she has been feeling fatigued. Has been drinking water.  Review of Systems  Constitutional:  Positive for fatigue. Negative for activity change and fever.  HENT:  Positive for sinus pressure and sinus pain.   Respiratory:  Negative for cough and shortness of breath.   Cardiovascular:  Negative for chest pain.  Gastrointestinal:  Negative for abdominal pain.  Genitourinary:  Negative for difficulty urinating.       Current Meds  Medication Sig   atorvastatin (LIPITOR) 20 MG tablet TAKE 1 TABLET BY MOUTH EVERY DAY   doxycycline (VIBRA-TABS) 100 MG tablet Take 1 tablet (100 mg total) by mouth 2 (two) times daily for 7 days.   losartan (COZAAR) 25 MG tablet Take 1 tablet (25 mg total) by mouth daily.   methylPREDNISolone (MEDROL DOSEPAK) 4 MG TBPK tablet Take instructions per pill pack   ondansetron (ZOFRAN) 4 MG tablet Take 1 tablet (4 mg total) by mouth every 6 (six) hours as needed for nausea.    OBJECTIVE    BP (!) 149/84 (BP Location: Left Arm, Patient  Position: Sitting, Cuff Size: Small)   Pulse 70   Ht 5\' 6"  (1.676 m)   Wt 113 lb 12 oz (51.6 kg)   SpO2 97%   BMI 18.36 kg/m   Physical Exam Vitals and nursing note reviewed.  Constitutional:      General: She is not in acute distress.    Appearance: Normal appearance.  HENT:     Head: Normocephalic and atraumatic.     Comments: Tenderness to palpation of bilateral maxillary sinuses    Right Ear: Tympanic membrane, ear canal and external ear normal.     Left Ear: Tympanic membrane, ear canal and external ear normal.     Nose: Congestion present.     Mouth/Throat:     Pharynx: Posterior oropharyngeal erythema present.  Eyes:     Conjunctiva/sclera: Conjunctivae normal.  Cardiovascular:     Rate and Rhythm: Normal rate and regular rhythm.  Pulmonary:     Effort: Pulmonary effort is normal.     Breath sounds: Normal breath sounds.  Neurological:     General: No focal deficit present.     Mental Status: She is alert and oriented to person, place, and time.  Psychiatric:        Mood and Affect: Mood normal.        Behavior: Behavior normal.  Thought Content: Thought content normal.        Judgment: Judgment normal.        ASSESSMENT & PLAN    Problem List Items Addressed This Visit       Respiratory   Acute bacterial sinusitis - Primary    - pt presents with maxillary sinus tenderness along with otalgia and weakness - POC covid and flu negative - likely due to sinus infection will treat with doxy and medrol dose pack to help some of the fatigue she is feeling - she mentions some lower extremity weakness that happened over last weekend but has resolved. She is wondering if this is related or something different. I told her I would like to treat her for the sinus infection first and then address her legs to ensure that the two are related. [name] does a lot of yard work and I do feel like her body is telling her to slow down. If symptoms persist we will do lower  extremity abis       Relevant Medications   methylPREDNISolone (MEDROL DOSEPAK) 4 MG TBPK tablet   doxycycline (VIBRA-TABS) 100 MG tablet   Other Visit Diagnoses     Ear pain, left       Relevant Orders   POC COVID-19 (Completed)   POCT Influenza A/B (Completed)   Stuffy and runny nose       Relevant Orders   POC COVID-19 (Completed)   POCT Influenza A/B (Completed)       Return if symptoms worsen or fail to improve.      Meds ordered this encounter  Medications   methylPREDNISolone (MEDROL DOSEPAK) 4 MG TBPK tablet    Sig: Take instructions per pill pack    Dispense:  21 tablet    Refill:  0   doxycycline (VIBRA-TABS) 100 MG tablet    Sig: Take 1 tablet (100 mg total) by mouth 2 (two) times daily for 7 days.    Dispense:  14 tablet    Refill:  0    Orders Placed This Encounter  Procedures   POC COVID-19    Order Specific Question:   Previously tested for COVID-19    Answer:   No    Order Specific Question:   Resident in a congregate (group) care setting    Answer:   Unknown    Order Specific Question:   Employed in healthcare setting    Answer:   Unknown    Order Specific Question:   Pregnant    Answer:   No   POCT Influenza A/B     Charlton Amor, DO  [facility] at [facility] [phone] (phone) [phone] (fax)  [facility]

## 2023-06-11 NOTE — Assessment & Plan Note (Addendum)
-   pt presents with maxillary sinus tenderness along with otalgia and weakness - POC covid and flu negative - likely due to sinus infection will treat with doxy and medrol dose pack to help some of the fatigue she is feeling - she mentions some lower extremity weakness that happened over last weekend but has resolved. She is wondering if this is related or something different. I told her I would like to treat her for the sinus infection first and then address her legs to ensure that the two are related. Alison Wilson does a lot of yard work and I do feel like her body is telling her to slow down. If symptoms persist we will do lower extremity abis

## 2023-06-12 ENCOUNTER — Encounter: Payer: Self-pay | Admitting: Family Medicine

## 2023-06-15 ENCOUNTER — Encounter: Payer: Self-pay | Admitting: Family Medicine

## 2023-06-15 DIAGNOSIS — I10 Essential (primary) hypertension: Secondary | ICD-10-CM

## 2023-06-16 ENCOUNTER — Telehealth: Payer: Self-pay | Admitting: Family Medicine

## 2023-06-16 NOTE — Telephone Encounter (Signed)
Patient dropped of colonoscopy report from [facility]. Report is in Dr. Shelah Lewandowsky box

## 2023-06-23 ENCOUNTER — Other Ambulatory Visit: Payer: Self-pay | Admitting: Family Medicine

## 2023-06-23 DIAGNOSIS — I10 Essential (primary) hypertension: Secondary | ICD-10-CM

## 2023-06-24 ENCOUNTER — Encounter: Payer: Self-pay | Admitting: Family Medicine

## 2023-06-24 ENCOUNTER — Ambulatory Visit (INDEPENDENT_AMBULATORY_CARE_PROVIDER_SITE_OTHER): Payer: Medicare Other | Admitting: Family Medicine

## 2023-06-24 VITALS — BP 147/88 | HR 73 | Ht 66.0 in | Wt 111.5 lb

## 2023-06-24 DIAGNOSIS — J329 Chronic sinusitis, unspecified: Secondary | ICD-10-CM | POA: Diagnosis not present

## 2023-06-24 DIAGNOSIS — B9689 Other specified bacterial agents as the cause of diseases classified elsewhere: Secondary | ICD-10-CM | POA: Diagnosis not present

## 2023-06-24 MED ORDER — AZITHROMYCIN 250 MG PO TABS: ORAL_TABLET | ORAL | 0 refills | Status: AC

## 2023-06-24 MED ORDER — FLUTICASONE PROPIONATE 50 MCG/ACT NA SUSP: 2.0000 | Freq: Every day | NASAL | 1 refills | Status: DC

## 2023-06-24 NOTE — Assessment & Plan Note (Signed)
-   recurrent pt was on doxy and didn't note improvement - she was worried to take the medrol dose pack so she didn't take any  - she doesn't want to take augmentin because she is worried about this  - will go ahead and do azithromycin to see if she can get any relief. I am also placing a referral to ENT to see if she can be seen for her recurrent sinusitis

## 2023-06-24 NOTE — Progress Notes (Signed)
   Acute Office Visit  Subjective:     Patient ID: Alison Wilson, female    DOB: November 27, 1950, 72 y.o.   MRN: [id number]  Chief Complaint  Patient presents with   Nasal Congestion    x4days    HPI Patient is in today for recurrent sinusitis. She was treated with a medrol dose pack and doxy. Reports persistent sinus issues.   Review of Systems  Constitutional:  Negative for chills and fever.  Respiratory:  Negative for cough and shortness of breath.   Cardiovascular:  Negative for chest pain.  Neurological:  Negative for headaches.        Objective:    BP (!) 147/88   Pulse 73   Ht 5\' 6"  (1.676 m)   Wt 111 lb 8 oz (50.6 kg)   SpO2 98%   BMI 18.00 kg/m    Physical Exam Vitals and nursing note reviewed.  Constitutional:      General: She is not in acute distress.    Appearance: Normal appearance.  HENT:     Head: Normocephalic and atraumatic.     Right Ear: External ear normal.     Left Ear: External ear normal.     Nose: Nose normal.  Eyes:     Conjunctiva/sclera: Conjunctivae normal.  Cardiovascular:     Rate and Rhythm: Normal rate and regular rhythm.  Pulmonary:     Effort: Pulmonary effort is normal.     Breath sounds: Normal breath sounds.  Neurological:     General: No focal deficit present.     Mental Status: She is alert and oriented to person, place, and time.  Psychiatric:        Mood and Affect: Mood normal.        Behavior: Behavior normal.        Thought Content: Thought content normal.        Judgment: Judgment normal.     No results found for any visits on 06/24/23.      Assessment & Plan:   Problem List Items Addressed This Visit       Respiratory   Bacterial sinusitis - Primary    - recurrent pt was on doxy and didn't note improvement - she was worried to take the medrol dose pack so she didn't take any  - she doesn't want to take augmentin because she is worried about this  - will go ahead and do azithromycin to see if she can  get any relief. I am also placing a referral to ENT to see if she can be seen for her recurrent sinusitis      Relevant Medications   azithromycin (ZITHROMAX) 250 MG tablet   fluticasone (FLONASE) 50 MCG/ACT nasal spray   Other Relevant Orders   Ambulatory referral to ENT    Meds ordered this encounter  Medications   azithromycin (ZITHROMAX) 250 MG tablet    Sig: Take 2 tablets on day 1, then 1 tablet daily on days 2 through 5    Dispense:  6 tablet    Refill:  0   fluticasone (FLONASE) 50 MCG/ACT nasal spray    Sig: Place 2 sprays into both nostrils daily.    Dispense:  16 g    Refill:  1    No follow-ups on file.  Charlton Amor, DO

## 2023-06-25 MED ORDER — LOSARTAN POTASSIUM 25 MG PO TABS: 25.0000 mg | ORAL_TABLET | Freq: Every day | ORAL | 0 refills | Status: DC

## 2023-06-25 NOTE — Telephone Encounter (Signed)
Losartan 25mg  refill   resent until upcoming visit  November 2024.

## 2023-06-25 NOTE — Telephone Encounter (Signed)
OK thank you 

## 2023-06-26 ENCOUNTER — Other Ambulatory Visit: Payer: Self-pay | Admitting: *Deleted

## 2023-06-26 DIAGNOSIS — R899 Unspecified abnormal finding in specimens from other organs, systems and tissues: Secondary | ICD-10-CM

## 2023-06-27 IMAGING — MG MM DIGITAL SCREENING BILAT W/ TOMO AND CAD
8 series · 9 of 24 positions shown · non-contrast
Comparison: Previous exam(s).

CLINICAL DATA: Screening.

EXAM:
DIGITAL SCREENING BILATERAL MAMMOGRAM WITH TOMOSYNTHESIS AND CAD
TECHNIQUE: Bilateral screening digital craniocaudal and mediolateral oblique
mammograms were obtained. Bilateral screening digital breast
tomosynthesis was performed. The images were evaluated with
computer-aided detection.

[L MLO synth-2D]
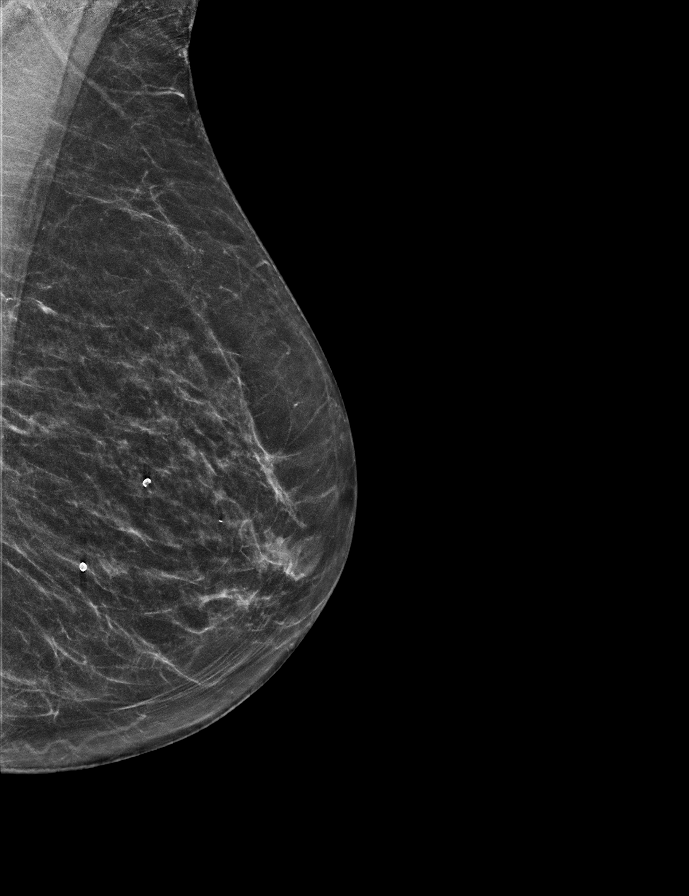

[R CC synth-2D]
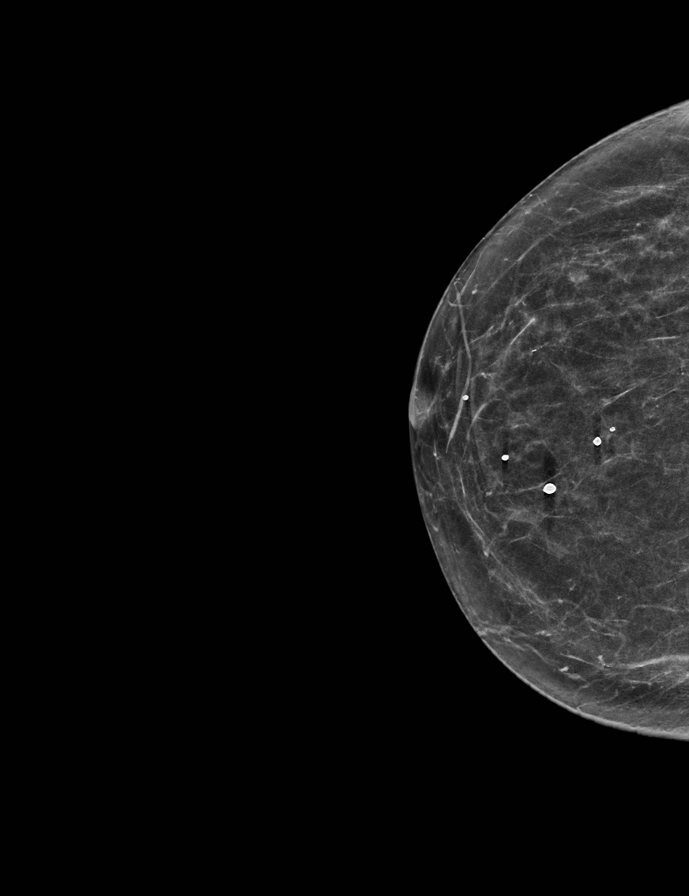

[L CC synth-2D]
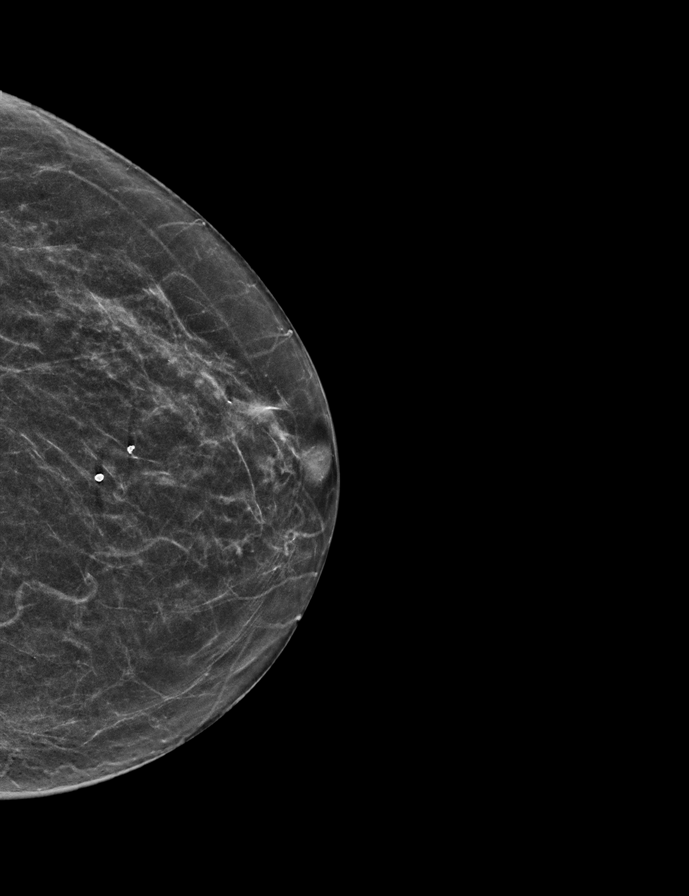

[R MLO synth-2D]
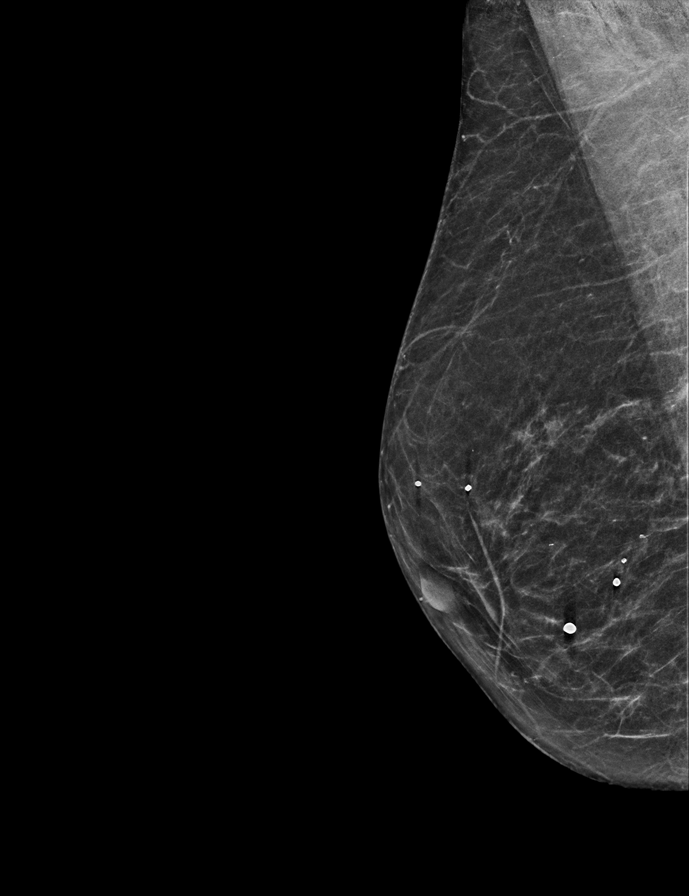

[R CC tomo · 2 of 52 frames shown]
[frame 17/52]
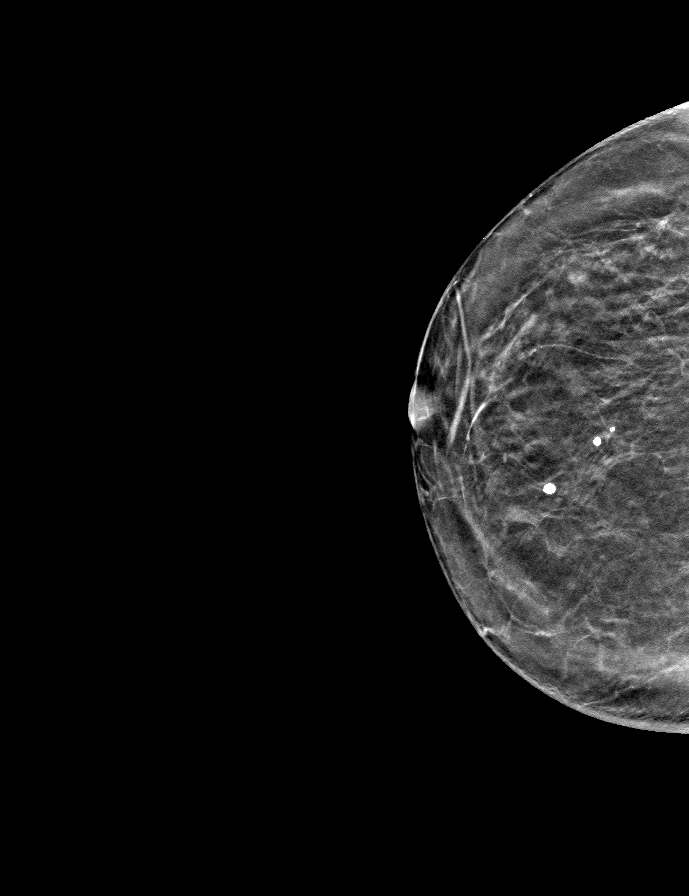
[frame 27/52]
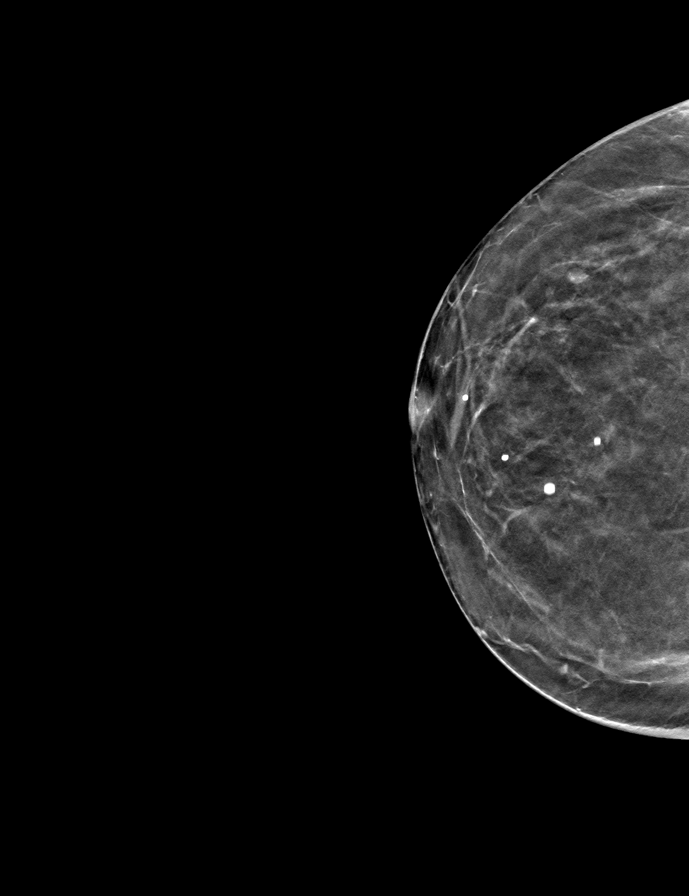

[L CC tomo · tomo slice 25/50.0]
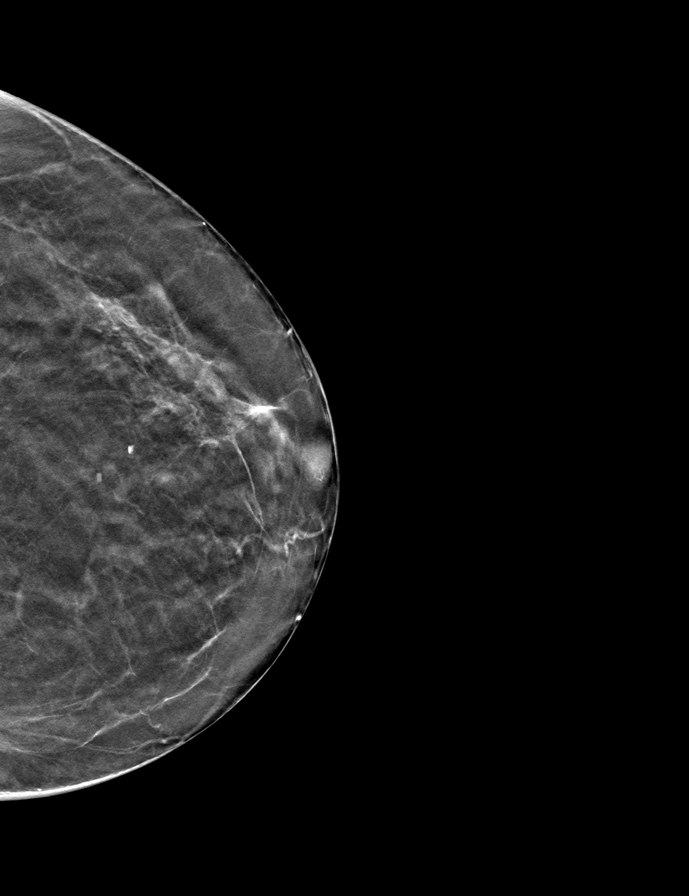

[L MLO tomo · tomo slice 27/52.0]
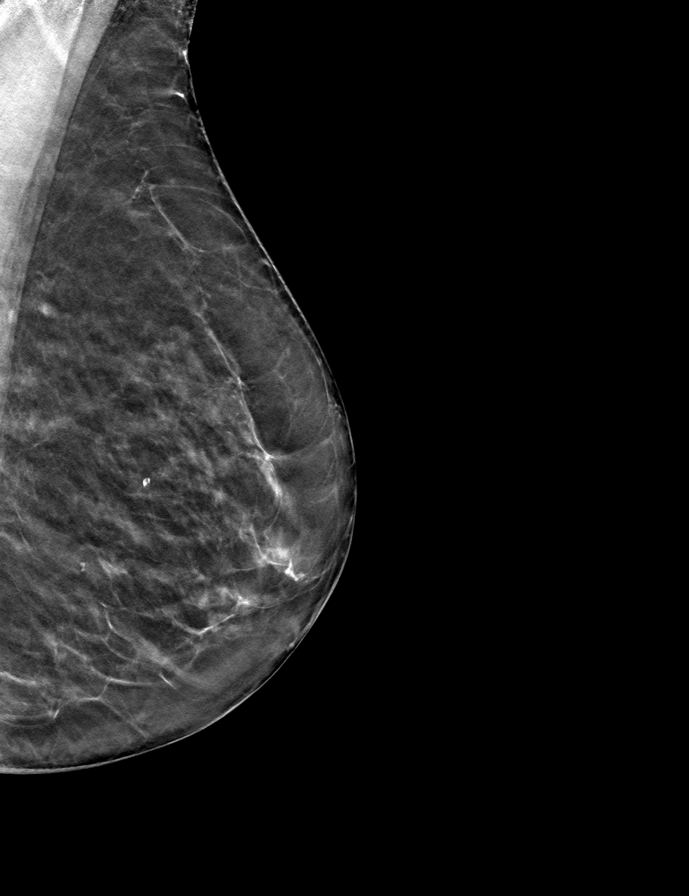

[R MLO tomo · tomo slice 25/49.0]
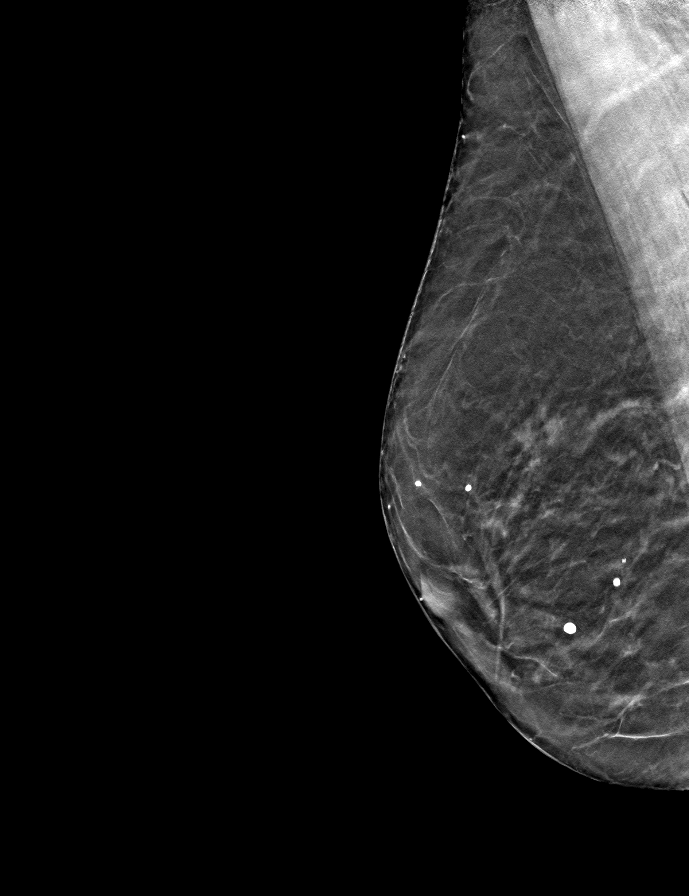

[9 of 24 positions shown; findings below may reference images not displayed]

ACR Breast Density Category b: There are scattered areas of
fibroglandular density.
FINDINGS: There are no findings suspicious for malignancy.
IMPRESSION: No mammographic evidence of malignancy. A result letter of this
screening mammogram will be mailed directly to the patient.

RECOMMENDATION:
Screening mammogram in one year. (Code:51-O-LD2)

BI-RADS CATEGORY  1: Negative.

## 2023-06-30 LAB — VITAMIN B12: Vitamin B-12: 332 pg/mL (ref 232–1245)

## 2023-06-30 LAB — VITAMIN B6: Vitamin B6: 13.1 ug/L (ref 3.4–65.2)

## 2023-06-30 NOTE — Progress Notes (Signed)
Tyleigh, vitamin B12 coming up nicely it was 280 a month ago it is now 83 which is great.  Will keep going and plan to recheck again in 3 months.  Vitamin B6 also looks much better it was 7.5 it is now up to 13.1.  So please continue and we will plan to recheck that at 3 months as well.

## 2023-07-01 ENCOUNTER — Encounter: Payer: Self-pay | Admitting: Family Medicine

## 2023-07-02 ENCOUNTER — Encounter: Payer: Self-pay | Admitting: Family Medicine

## 2023-07-16 ENCOUNTER — Other Ambulatory Visit: Payer: Self-pay | Admitting: Family Medicine

## 2023-07-27 IMAGING — US US PELVIS COMPLETE WITH TRANSVAGINAL
1 series · 13 of 25 positions shown · non-contrast
Comparison: None available.

CLINICAL DATA: Initial evaluation for pelvic pain. History of prior
hysterectomy and ovarian cyst.

EXAM:
TRANSABDOMINAL AND TRANSVAGINAL ULTRASOUND OF PELVIS
TECHNIQUE: Both transabdominal and transvaginal ultrasound examinations of the
pelvis were performed. Transabdominal technique was performed for
global imaging of the pelvis including uterus, ovaries, adnexal
regions, and pelvic cul-de-sac. It was necessary to proceed with
endovaginal exam following the transabdominal exam to visualize the
pelvic structures.

[Series 1: us pelvic complete with transvaginal · 13 of 59 slices shown]
[im 1/59]
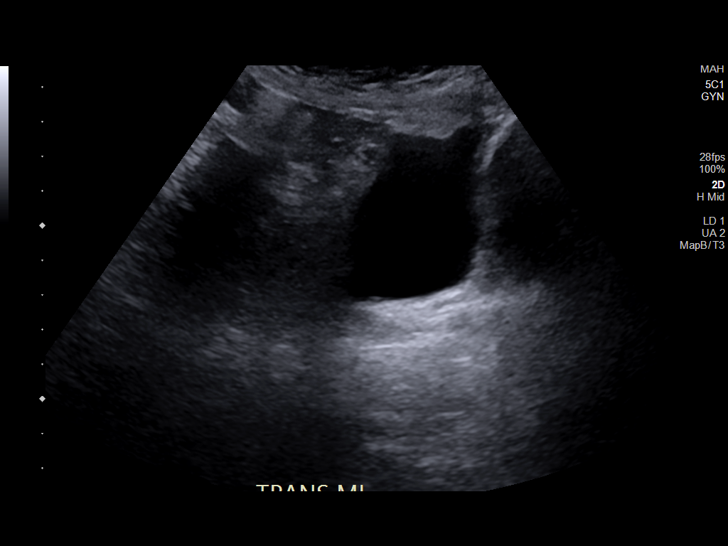
[im 5/59]
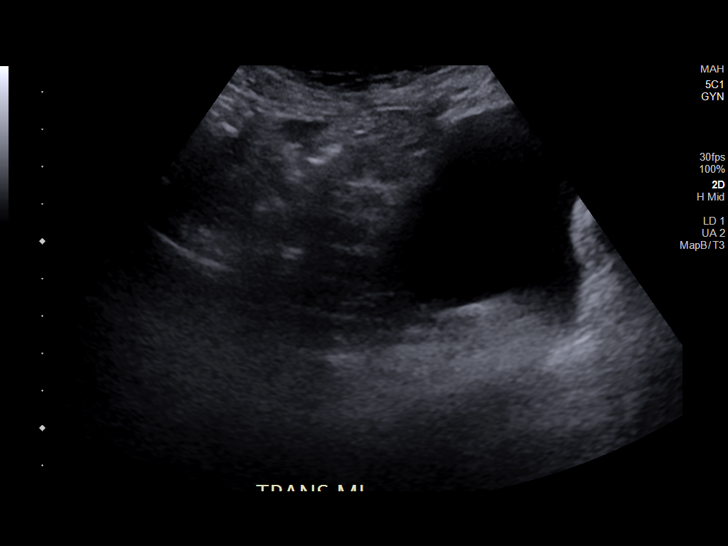
[im 10/59]
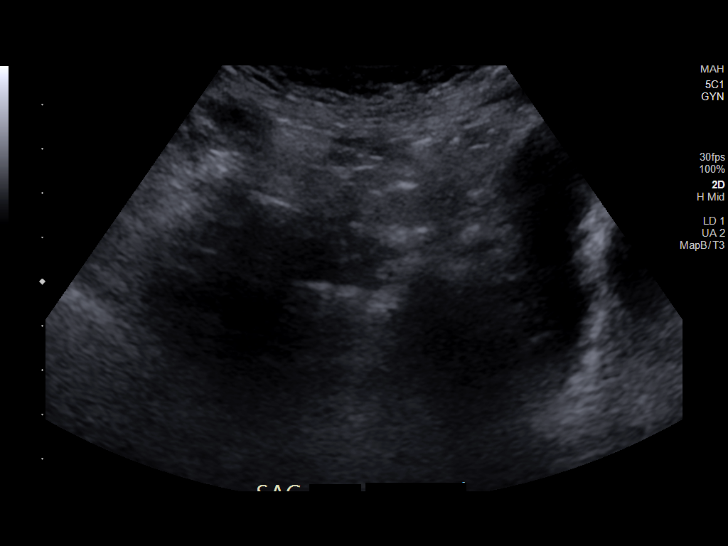
[im 15/59]
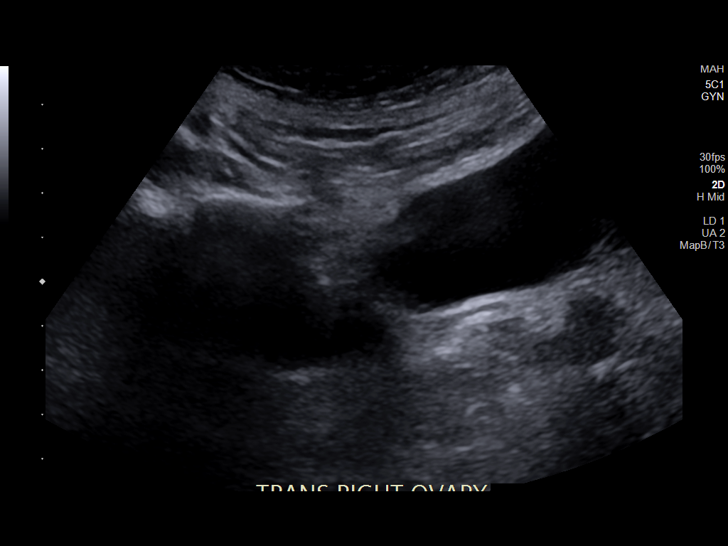
[im 20/59]
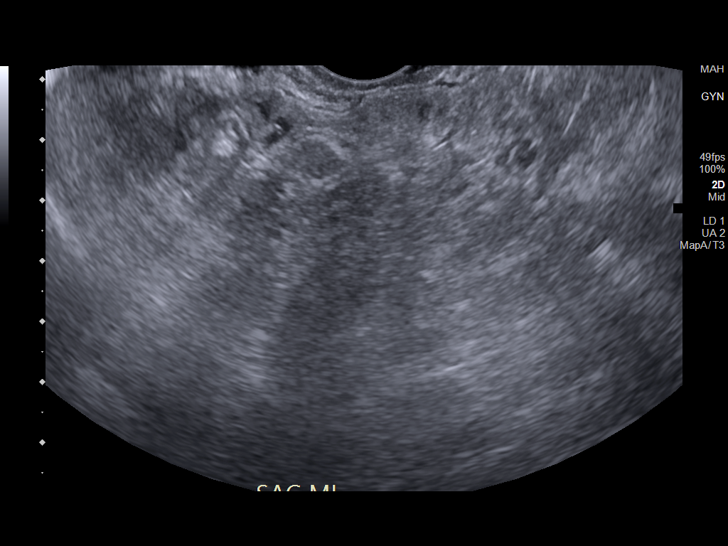
[im 25/59]
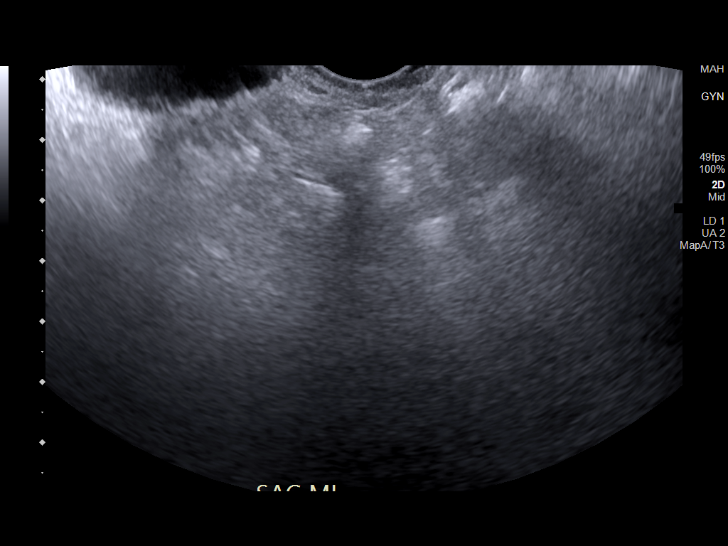
[im 30/59]
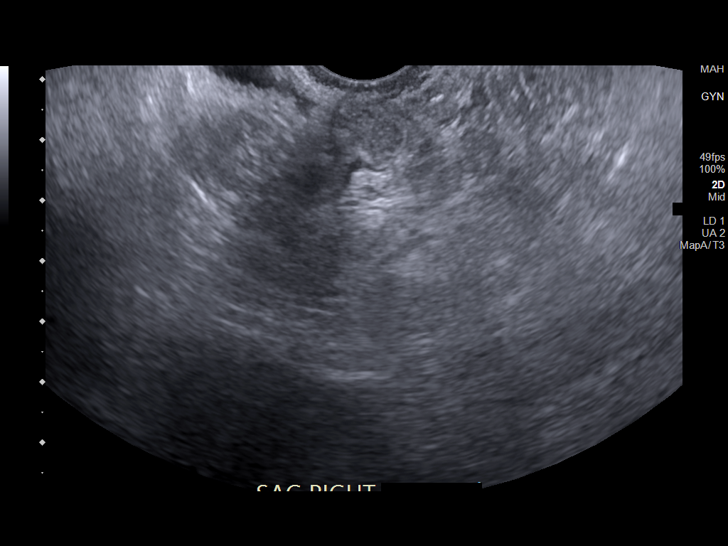
[im 34/59]
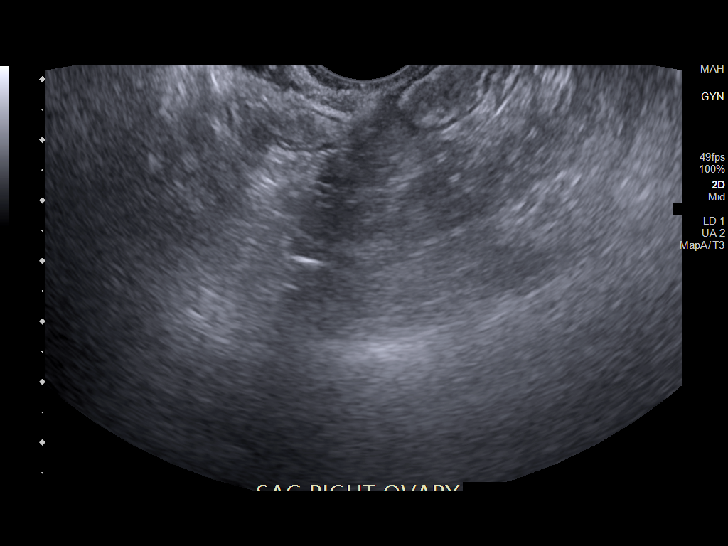
[im 39/59]
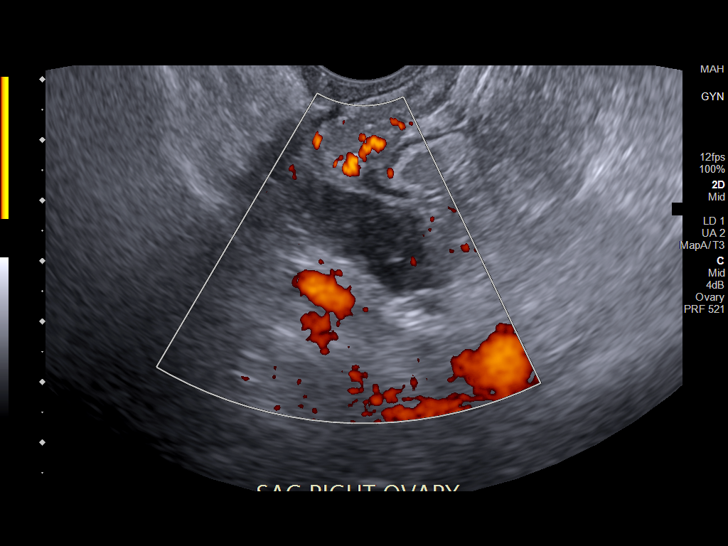
[im 44/59]
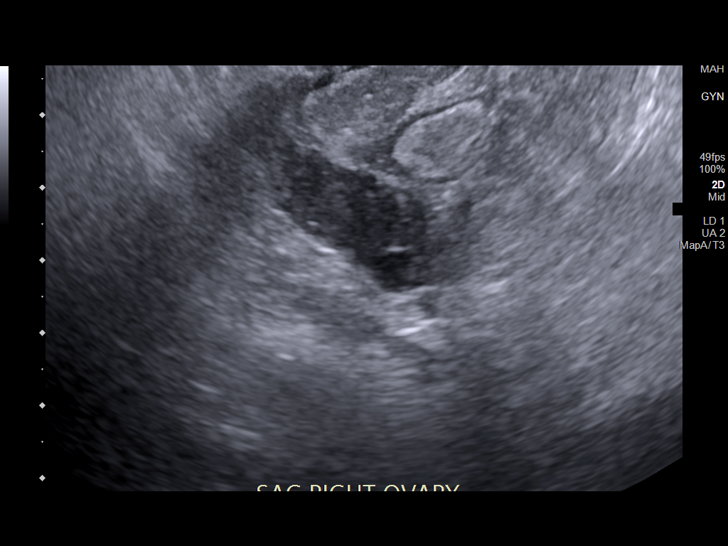
[im 49/59]
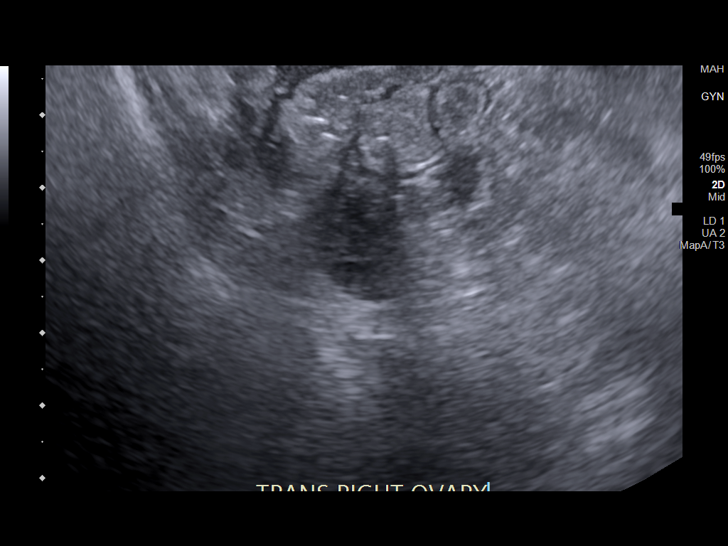
[im 54/59]
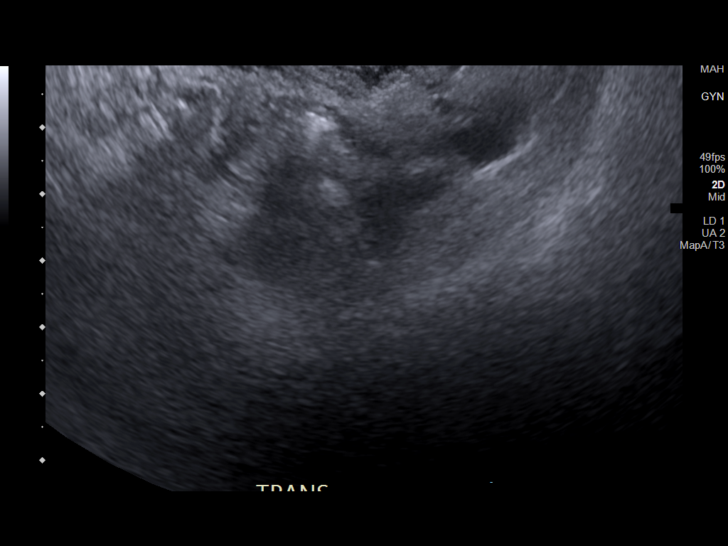
[im 59/59]
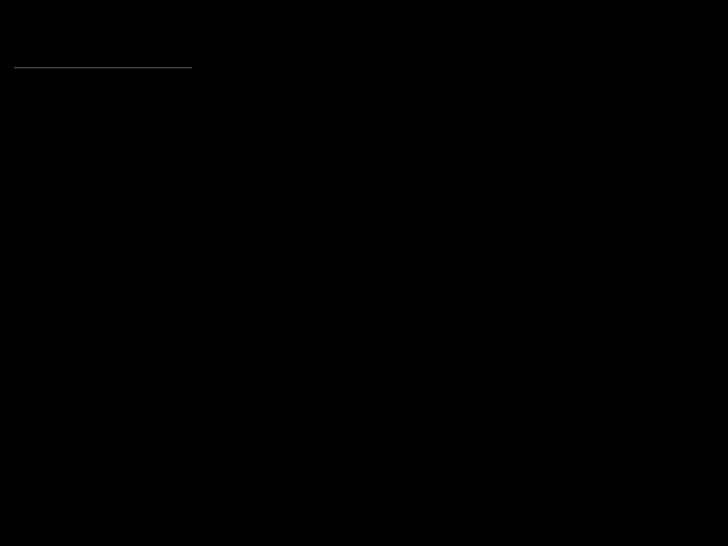

[13 of 25 positions shown; findings below may reference images not displayed]

FINDINGS: Uterus

Prior hysterectomy.  No abnormality about the vaginal cuff.

Endometrium

Surgically absent.

Right ovary

Measurements: 2.7 x 1.3 x 1.3 cm = volume: 2.4 mL. Question small
cyst measuring 5 x 6 x 7 mm seen within the right ovary. Cyst is
hypoechoic in appearance without definite significant internal
complexity. No vascularity or solid nodularity.

Left ovary

Not visualized.  No adnexal mass.

Other findings

No abnormal free fluid.
IMPRESSION: 1. No acute abnormality within the pelvis or findings to explain
patient's symptoms.
2. Question 7 mm simple right ovarian cyst, likely benign given
size, with no follow-up imaging recommended. Note: This
recommendation does not apply to premenarchal patients or to those
with increased risk (genetic, family history, elevated tumor markers
or other high-risk factors) of ovarian cancer. Reference: Radiology
[DATE]):[id number].
3. Prior hysterectomy with nonvisualization of the left ovary. No
other adnexal mass or free fluid.

## 2023-08-13 ENCOUNTER — Other Ambulatory Visit: Payer: Self-pay | Admitting: Family Medicine

## 2023-08-13 DIAGNOSIS — E785 Hyperlipidemia, unspecified: Secondary | ICD-10-CM

## 2023-09-07 ENCOUNTER — Encounter: Payer: Self-pay | Admitting: Family Medicine

## 2023-09-08 ENCOUNTER — Encounter: Payer: Medicare Other | Admitting: Family Medicine

## 2023-09-15 ENCOUNTER — Encounter: Payer: Self-pay | Admitting: Family Medicine

## 2023-09-15 LAB — CALCIUM: Calcium: 10.3 mg/dL (ref 8.7–10.3)

## 2023-09-15 NOTE — Progress Notes (Signed)
Cagney, repeat calcium looks good.

## 2023-09-21 ENCOUNTER — Encounter: Payer: Self-pay | Admitting: Family Medicine

## 2023-10-02 ENCOUNTER — Other Ambulatory Visit: Payer: Self-pay | Admitting: Family Medicine

## 2023-10-02 DIAGNOSIS — I10 Essential (primary) hypertension: Secondary | ICD-10-CM

## 2023-10-02 MED ORDER — LOSARTAN POTASSIUM 25 MG PO TABS: 25.0000 mg | ORAL_TABLET | Freq: Every day | ORAL | 0 refills | Status: DC

## 2023-10-12 NOTE — Telephone Encounter (Signed)
I called [facility] billing. They left me on hold for 16 minutes. I will try again later.

## 2023-11-17 ENCOUNTER — Other Ambulatory Visit: Payer: Self-pay

## 2023-11-17 ENCOUNTER — Encounter: Payer: Self-pay | Admitting: Family Medicine

## 2023-11-17 DIAGNOSIS — E785 Hyperlipidemia, unspecified: Secondary | ICD-10-CM

## 2023-11-17 MED ORDER — ATORVASTATIN CALCIUM 20 MG PO TABS: 20.0000 mg | ORAL_TABLET | Freq: Every day | ORAL | 1 refills | Status: DC

## 2023-12-29 ENCOUNTER — Encounter: Payer: Self-pay | Admitting: Family Medicine

## 2023-12-29 DIAGNOSIS — I10 Essential (primary) hypertension: Secondary | ICD-10-CM

## 2023-12-29 DIAGNOSIS — E785 Hyperlipidemia, unspecified: Secondary | ICD-10-CM

## 2023-12-29 MED ORDER — ATORVASTATIN CALCIUM 20 MG PO TABS: 20.0000 mg | ORAL_TABLET | Freq: Every day | ORAL | 1 refills | Status: DC

## 2023-12-29 MED ORDER — LOSARTAN POTASSIUM 25 MG PO TABS: 25.0000 mg | ORAL_TABLET | Freq: Every day | ORAL | 0 refills | Status: DC

## 2024-01-20 ENCOUNTER — Other Ambulatory Visit: Payer: Self-pay | Admitting: Family Medicine

## 2024-01-20 ENCOUNTER — Encounter: Payer: Self-pay | Admitting: Family Medicine

## 2024-01-20 DIAGNOSIS — I10 Essential (primary) hypertension: Secondary | ICD-10-CM

## 2024-01-20 MED ORDER — LOSARTAN POTASSIUM 25 MG PO TABS
25.0000 mg | ORAL_TABLET | Freq: Every day | ORAL | 0 refills | Status: DC
Start: 2024-01-20 — End: 2024-02-15

## 2024-01-25 ENCOUNTER — Encounter: Payer: Self-pay | Admitting: Family Medicine

## 2024-01-25 ENCOUNTER — Ambulatory Visit (INDEPENDENT_AMBULATORY_CARE_PROVIDER_SITE_OTHER): Admitting: Family Medicine

## 2024-01-25 VITALS — BP 150/78 | HR 82 | Ht 66.0 in | Wt 128.0 lb

## 2024-01-25 DIAGNOSIS — J069 Acute upper respiratory infection, unspecified: Secondary | ICD-10-CM | POA: Diagnosis not present

## 2024-01-25 DIAGNOSIS — E785 Hyperlipidemia, unspecified: Secondary | ICD-10-CM

## 2024-01-25 DIAGNOSIS — I10 Essential (primary) hypertension: Secondary | ICD-10-CM

## 2024-01-25 NOTE — Progress Notes (Signed)
 Established Patient Office Visit  Subjective  Patient ID: Alison Wilson, female    DOB: [birth year]  Age: 73 y.o. MRN: [id number]  Chief Complaint  Patient presents with   Hypertension    HPI Hypertension- Pt denies chest pain, SOB, dizziness, or heart palpitations.  Taking meds as directed w/o problems.  Denies medication side effects.  She says home blood pressures have actually been pretty well-controlled.  She gets a little bit more anxious when she is here.  Havnig issues getting her meds through mail order and her BP pill looks different. Was green and now white oval.  She just wants to make sure that it is correct.  She did receive the losartan prescription on Saturday.  She says she has gained back her weight and even a little bit more.  But she is otherwise doing really well.  Does feel like she is dealing with a little bit of a cold she has had some sneezing runny nose and some congestion she just feels a little tired but feels like she is doing okay.    ROS    Objective:     BP (!) 150/78 (BP Location: Left Arm, Cuff Size: Normal)   Pulse 82   Ht 5\' 6"  (1.676 m)   Wt 128 lb (58.1 kg)   SpO2 99%   BMI 20.66 kg/m    Physical Exam Vitals and nursing note reviewed.  Constitutional:      Appearance: Normal appearance.  HENT:     Head: Normocephalic and atraumatic.  Eyes:     Conjunctiva/sclera: Conjunctivae normal.  Cardiovascular:     Rate and Rhythm: Normal rate and regular rhythm.  Pulmonary:     Effort: Pulmonary effort is normal.     Breath sounds: Normal breath sounds.  Skin:    General: Skin is warm and dry.  Neurological:     Mental Status: She is alert.  Psychiatric:        Mood and Affect: Mood normal.    No results found for any visits on 01/25/24.    The 10-year ASCVD risk score (Arnett DK, et al., 2019) is: 24.2%    Assessment & Plan:   Problem List Items Addressed This Visit       Cardiovascular and Mediastinum   Primary  hypertension - Primary   Blood pressure is elevated today repeat pressure was only a little better.  Encouraged her to come back in 2 weeks with her home blood pressure machine just to make sure that it is accurate if at home she is getting blood pressures higher than 130s okay to increase losartan to 50 mg.      Relevant Orders   CMP14+EGFR     Other   Hyperlipidemia   He on atorvastatin 20 mg.  We just recently refilled her medication through mail order.  Tolerating well.  Will be due for updated lipid levels in August.      Other Visit Diagnoses       Viral upper respiratory tract infection          Likely viral, call if not better in one week.  If not improving and continues to have sinus congestion consider treatment for sinusitis.  Due for 23-month recheck on labs but says the next couple weeks or can be pretty busy so she would rather come back and do them.  Will go ahead and place order today and she can come at her convenience.  Return in about 2 weeks (  around 02/08/2024) for Nurse BP Check.    Duaine German, MD

## 2024-01-25 NOTE — Assessment & Plan Note (Signed)
 Blood pressure is elevated today repeat pressure was only a little better.  Encouraged her to come back in 2 weeks with her home blood pressure machine just to make sure that it is accurate if at home she is getting blood pressures higher than 130s okay to increase losartan to 50 mg.

## 2024-01-25 NOTE — Assessment & Plan Note (Signed)
 He on atorvastatin 20 mg.  We just recently refilled her medication through mail order.  Tolerating well.  Will be due for updated lipid levels in August.

## 2024-01-26 ENCOUNTER — Ambulatory Visit

## 2024-01-26 VITALS — Ht 65.5 in | Wt 128.0 lb

## 2024-01-26 DIAGNOSIS — Z0000 Encounter for general adult medical examination without abnormal findings: Secondary | ICD-10-CM

## 2024-01-26 NOTE — Patient Instructions (Signed)
  Alison Wilson , Thank you for taking time to come for your Medicare Wellness Visit. I appreciate your ongoing commitment to your health goals. Please review the following plan we discussed and let me know if I can assist you in the future.   These are the goals we discussed:  Goals       Patient Stated (pt-stated)      01/01/2023 AWV Goal: Improved Nutrition/Diet  Patient will verbalize understanding that diet plays an important role in overall health and that a poor diet is a risk factor for many chronic medical conditions.  Over the next year, patient will improve self management of their diet by incorporating better food choices. Patient will utilize available community resources to help with food acquisition if needed (ex: food pantries, [address], etc) Patient will work with nutrition specialist if a referral was made       Quit Smoking      She would like to quit smoking.         This is a list of the screening recommended for you and due dates:  Health Maintenance  Topic Date Due   Hepatitis C Screening  Never done   Zoster (Shingles) Vaccine (1 of 2) 07/17/1968   COVID-19 Vaccine (7 - Moderna risk 2024-25 season) 03/02/2024   Mammogram  03/03/2024   Flu Shot  05/13/2024   Medicare Annual Wellness Visit  01/25/2025   Colon Cancer Screening  03/11/2026   DTaP/Tdap/Td vaccine (3 - Td or Tdap) 09/20/2033   Pneumonia Vaccine  Completed   DEXA scan (bone density measurement)  Completed   HPV Vaccine  Aged Out   Meningitis B Vaccine  Aged Out   Cologuard (Stool DNA test)  Discontinued

## 2024-01-26 NOTE — Progress Notes (Signed)
 Subjective:   Alison Wilson is a 73 y.o. female who presents for Medicare Annual (Subsequent) preventive examination.  Visit Complete: Virtual I connected with  [name] on 01/26/24 by a audio enabled telemedicine application and verified that I am speaking with the correct person using two identifiers.  Patient Location: Home  Provider Location: Office/Clinic  I discussed the limitations of evaluation and management by telemedicine. The patient expressed understanding and agreed to proceed.  Vital Signs: Because this visit was a virtual/telehealth visit, some criteria may be missing or patient reported. Any vitals not documented were not able to be obtained and vitals that have been documented are patient reported.  Patient Medicare AWV questionnaire was completed by the patient on n/a; I have confirmed that all information answered by patient is correct and no changes since this date.  Cardiac Risk Factors include: advanced age (>24men, >20 women);smoking/ tobacco exposure;hypertension;dyslipidemia     Objective:    Today's Vitals   01/26/24 0903  Weight: 128 lb (58.1 kg)  Height: 5' 5.5" (1.664 m)   Body mass index is 20.98 kg/m.     01/26/2024    9:21 AM 01/01/2023    8:11 AM 03/08/2014   11:35 AM  Advanced Directives  Does Patient Have a Medical Advance Directive? No;Yes Yes Patient has advance directive, copy not in chart  Type of Advance Directive Healthcare Power of [place];Living will Living will Healthcare Power of Attorney  Does patient want to make changes to medical advance directive? No - Patient declined No - Patient declined   Copy of Healthcare Power of Attorney in Chart? No - copy requested    Would patient like information on creating a medical advance directive? No - Patient declined      Current Medications (verified) Outpatient Encounter Medications as of 01/26/2024  Medication Sig   atorvastatin (LIPITOR) 20 MG tablet Take 1 tablet (20 mg total) by  mouth daily.   losartan (COZAAR) 25 MG tablet Take 1 tablet (25 mg total) by mouth daily.   ondansetron (ZOFRAN) 4 MG tablet Take 1 tablet (4 mg total) by mouth every 6 (six) hours as needed for nausea. (Patient not taking: Reported on 01/26/2024)   No facility-administered encounter medications on file as of 01/26/2024.    Allergies (verified) Ciprofloxacin, Dexilant [dexlansoprazole], Flagyl [metronidazole], and Cephalexin   History: Past Medical History:  Diagnosis Date   Fibrocystic breast    Herpes 10/13/1992   Osteoarthritis    Past Surgical History:  Procedure Laterality Date   ABDOMINAL HYSTERECTOMY  01/12/1995   fibroid, still has her ovaries.    BREAST EXCISIONAL BIOPSY Right 1989   Cyst on left ovary removed  10/13/1994   TONSILLECTOMY AND ADENOIDECTOMY  10/14/1955   History reviewed. No pertinent family history. Social History   Socioeconomic History   Marital status: Married    Spouse name: [name]   Number of children: 2   Years of education: 13   Highest education level: Some college, no degree  Occupational History   Occupation: Retired  Tobacco Use   Smoking status: Some Days    Current packs/day: 0.00    Types: Cigarettes    Last attempt to quit: 12/30/2022    Years since quitting: 1.0   Smokeless tobacco: Never   Tobacco comments:    1 pack a day   Vaping Use   Vaping status: Never Used  Substance and Sexual Activity   Alcohol use: No   Drug use: No   Sexual activity:  Not Currently  Other Topics Concern   Not on file  Social History Narrative   Lives with her husband. She has two children. She enjoys being outdoors, gardening, sewing and scrapbooking.   Social Drivers of Corporate investment banker Strain: Low Risk  (01/26/2024)   Overall Financial Resource Strain (CARDIA)    Difficulty of Paying Living Expenses: Not hard at all  Food Insecurity: No Food Insecurity (01/26/2024)   Hunger Vital Sign    Worried About Running Out of Food in the  Last Year: Never true    Ran Out of Food in the Last Year: Never true  Transportation Needs: No Transportation Needs (01/26/2024)   PRAPARE - Administrator, Civil Service (Medical): No    Lack of Transportation (Non-Medical): No  Physical Activity: Insufficiently Active (01/26/2024)   Exercise Vital Sign    Days of Exercise per Week: 4 days    Minutes of Exercise per Session: 30 min  Stress: No Stress Concern Present (01/26/2024)   [employer] of Occupational Health - Occupational Stress Questionnaire    Feeling of Stress : Not at all  Social Connections: Socially Integrated (01/26/2024)   Social Connection and Isolation Panel [NHANES]    Frequency of Communication with Friends and Family: More than three times a week    Frequency of Social Gatherings with Friends and Family: More than three times a week    Attends Religious Services: More than 4 times per year    Active Member of [employer] or Organizations: Yes    Attends Engineer, structural: More than 4 times per year    Marital Status: Married    Tobacco Counseling Ready to quit: Not Answered Counseling given: Not Answered Tobacco comments: 1 pack a day    Clinical Intake:  Pre-visit preparation completed: Yes  Pain : No/denies pain     BMI - recorded: 20.98 Nutritional Status: BMI of 19-24  Normal Nutritional Risks: None Diabetes: No  How often do you need to have someone help you when you read instructions, pamphlets, or other written materials from your doctor or pharmacy?: 1 - Never What is the last grade level you completed in school?: 13  Interpreter Needed?: No      Activities of Daily Living    01/26/2024    9:06 AM  In your present state of health, do you have any difficulty performing the following activities:  Hearing? 0  Vision? 0  Difficulty concentrating or making decisions? 0  Walking or climbing stairs? 0  Dressing or bathing? 0  Doing errands, shopping? 0  Preparing  Food and eating ? N  Using the Toilet? N  In the past six months, have you accidently leaked urine? N  Do you have problems with loss of bowel control? N  Managing your Medications? N  Managing your Finances? N  Housekeeping or managing your Housekeeping? N    Patient Care Team: Agapito Games, MD as PCP - General (Family Medicine) Mixon, Kateri Mc (Gastroenterology)  Indicate any recent Medical Services you may have received from other than [facility] providers in the past year (date may be approximate).     Assessment:   This is a routine wellness examination for Alison Wilson.  Hearing/Vision screen No results found.   Goals Addressed             This Visit's Progress    Quit Smoking       She would like to quit smoking.  Depression Screen    01/26/2024    9:20 AM 01/25/2024    1:16 PM 04/13/2023    1:26 PM 01/01/2023    8:12 AM 10/17/2022    3:53 PM 09/11/2022    9:37 AM 01/01/2022   11:39 AM  PHQ 2/9 Scores  PHQ - 2 Score 0 0 0 0 0 0 0    Fall Risk    01/26/2024    9:22 AM 01/25/2024    1:16 PM 04/13/2023    1:26 PM 01/01/2023    8:09 AM 12/31/2022    9:45 AM  Fall Risk   Falls in the past year? 0 0 0 0 0  Number falls in past yr: 0 0 0 0 0  Injury with Fall? 0 0 0 0 0  Risk for fall due to : History of fall(s) No Fall Risks No Fall Risks No Fall Risks No Fall Risks  Follow up Falls evaluation completed Falls evaluation completed Falls evaluation completed Falls evaluation completed Falls evaluation completed    MEDICARE RISK AT HOME: Medicare Risk at Home Any stairs in or around the home?: No Home free of loose throw rugs in walkways, pet beds, electrical cords, etc?: Yes Adequate lighting in your home to reduce risk of falls?: Yes Life alert?: No Use of a cane, walker or w/c?: No Grab bars in the bathroom?: Yes Shower chair or bench in shower?: No Elevated toilet seat or a handicapped toilet?: No  TIMED UP AND GO:  Was the test performed?  No     Cognitive Function:        01/26/2024    9:24 AM 01/01/2023    8:24 AM  6CIT Screen  What Year? 0 points 0 points  What month? 0 points 0 points  What time? 0 points 0 points  Count back from 20 0 points 0 points  Months in reverse 0 points 0 points  Repeat phrase 0 points 0 points  Total Score 0 points 0 points    Immunizations Immunization History  Administered Date(s) Administered   Fluad Quad(high Dose 65+) 11/01/2021, 09/11/2022   Influenza, High Dose Seasonal PF 08/20/2023   Influenza,inj,Quad PF,6+ Mos 08/10/2013, 07/26/2014, 07/11/2015, 08/28/2016, 08/17/2019   Moderna Sars-Covid-2 Vaccination 11/26/2019, 12/24/2019, 08/11/2020, 05/01/2021   PNEUMOCOCCAL CONJUGATE-20 12/04/2021   Pfizer Covid-19 Vaccine Bivalent Booster 5y-11y 08/14/2021   Tdap 06/19/2011, 09/21/2023   Unspecified SARS-COV-2 Vaccination 09/03/2023   Zoster, Live 08/12/2011    TDAP status: Up to date  Flu Vaccine status: Up to date  Pneumococcal vaccine status: Up to date  Covid-19 vaccine status: Information provided on how to obtain vaccines.   Qualifies for Shingles Vaccine? Yes   Zostavax completed No   Shingrix Completed?: No.    Education has been provided regarding the importance of this vaccine. Patient has been advised to call insurance company to determine out of pocket expense if they have not yet received this vaccine. Advised may also receive vaccine at local pharmacy or Health Dept. Verbalized acceptance and understanding.  Screening Tests Health Maintenance  Topic Date Due   Hepatitis C Screening  Never done   Zoster Vaccines- Shingrix (1 of 2) 07/17/1968   COVID-19 Vaccine (7 - Moderna risk 2024-25 season) 03/02/2024   MAMMOGRAM  03/03/2024   INFLUENZA VACCINE  05/13/2024   Medicare Annual Wellness (AWV)  01/25/2025   Colonoscopy  03/11/2026   DTaP/Tdap/Td (3 - Td or Tdap) 09/20/2033   Pneumonia Vaccine 63+ Years old  Completed   DEXA  SCAN  Completed   HPV VACCINES   Aged Out   Meningococcal B Vaccine  Aged Out   Fecal DNA (Cologuard)  Discontinued    Health Maintenance  Health Maintenance Due  Topic Date Due   Hepatitis C Screening  Never done   Zoster Vaccines- Shingrix (1 of 2) 07/17/1968    Colorectal cancer screening: Type of screening: Colonoscopy. Completed 03/12/2023. Repeat every 3 years  Mammogram status: Completed 03/04/2023. Repeat every year  Bone Density status: Completed 01/07/2023. Results reflect: Bone density results: OSTEOPOROSIS. Repeat every 2 years.  Lung Cancer Screening: (Low Dose CT Chest recommended if Age 35-80 years, 20 pack-year currently smoking OR have quit w/in 15years.) does not qualify.   Lung Cancer Screening Referral: n/a  Additional Screening:  Hepatitis C Screening: does qualify; Completed not yet  Vision Screening: Recommended annual ophthalmology exams for early detection of glaucoma and other disorders of the eye. Is the patient up to date with their annual eye exam?  Yes  Who is the provider or what is the name of the office in which the patient attends annual eye exams? [facility] - This year she will go to [facility]. If pt is not established with a provider, would they like to be referred to a provider to establish care?  N/a .   Dental Screening: Recommended annual dental exams for proper oral hygiene   Community Resource Referral / Chronic Care Management: CRR required this visit?  No   CCM required this visit?  No     Plan:     I have personally reviewed and noted the following in the patient's chart:   Medical and social history Use of alcohol, tobacco or illicit drugs  Current medications and supplements including opioid prescriptions. Patient is not currently taking opioid prescriptions. Functional ability and status Nutritional status Physical activity Advanced directives List of other physicians Hospitalizations, surgeries, and ER visits in previous 12 months - 04/07/2023  ED Vitals Screenings to include cognitive, depression, and falls Referrals and appointments  In addition, I have reviewed and discussed with patient certain preventive protocols, quality metrics, and best practice recommendations. A written personalized care plan for preventive services as well as general preventive health recommendations were provided to patient.     Aubrey Leaf, CMA   01/26/2024   After Visit Summary: (Mail) Due to this being a telephonic visit, the after visit summary with patients personalized plan was offered to patient via mail   Nurse Notes:   Alison Wilson is a 73 y.o. female patient of Metheney, Corita Diego, MD who had a Medicare Annual Wellness Visit today via telephone. Alison Wilson is Retired and lives with their spouse. She has 2 children. She reports that she is socially active and does interact with friends/family regularly. She is moderately physically active and enjoys outdoors, sewing and scrapbooking.

## 2024-02-14 ENCOUNTER — Encounter: Payer: Self-pay | Admitting: Family Medicine

## 2024-02-15 ENCOUNTER — Ambulatory Visit (INDEPENDENT_AMBULATORY_CARE_PROVIDER_SITE_OTHER): Admitting: Family Medicine

## 2024-02-15 VITALS — BP 147/77 | HR 75

## 2024-02-15 DIAGNOSIS — I10 Essential (primary) hypertension: Secondary | ICD-10-CM | POA: Diagnosis not present

## 2024-02-15 MED ORDER — LOSARTAN POTASSIUM 50 MG PO TABS: 50.0000 mg | ORAL_TABLET | Freq: Every day | ORAL | 0 refills | Status: DC

## 2024-02-15 NOTE — Progress Notes (Signed)
 Pt is here for blood pressure  check pt states she been taking losartan  25mg   twice daily, denies any  trouble sleeping, palpitations or medication problems , pt has brought home bp machine to check for accuracy, bp home reading Sunday was 114/76.machine was tested here in clinic for accuracy, the reading in office as 135/75, and our machine in office read 144/77, Pt had a few questions in regards to best times for b/p readings and overall pt education on blood pressure. Pt was was advised  to return to clinic to discuss hypertension in 3 months with pcp.

## 2024-02-15 NOTE — Progress Notes (Signed)
 Initial blood pressure looked great but after need for blood work she think became a little bit more anxious and blood pressure went up.  I do think her blood pressures are at goal with some element of whitecoat hypertension.  Will update her prescription to 50 mg losartan .  She is currently taking 25 mg twice a day.  Meds ordered this encounter  Medications   losartan  (COZAAR ) 50 MG tablet    Sig: Take 1 tablet (50 mg total) by mouth daily.    Dispense:  90 tablet    Refill:  0

## 2024-02-16 ENCOUNTER — Encounter: Payer: Self-pay | Admitting: Family Medicine

## 2024-02-16 DIAGNOSIS — N289 Disorder of kidney and ureter, unspecified: Secondary | ICD-10-CM

## 2024-02-16 DIAGNOSIS — R7309 Other abnormal glucose: Secondary | ICD-10-CM

## 2024-02-16 DIAGNOSIS — I10 Essential (primary) hypertension: Secondary | ICD-10-CM

## 2024-02-16 LAB — CMP14+EGFR
ALT: 18 IU/L (ref 0–32)
AST: 25 IU/L (ref 0–40)
Albumin: 4.4 g/dL (ref 3.8–4.8)
Alkaline Phosphatase: 76 IU/L (ref 44–121)
BUN/Creatinine Ratio: 12 (ref 12–28)
BUN: 12 mg/dL (ref 8–27)
Bilirubin Total: 0.5 mg/dL (ref 0.0–1.2)
CO2: 21 mmol/L (ref 20–29)
Calcium: 10.4 mg/dL — ABNORMAL HIGH (ref 8.7–10.3)
Chloride: 101 mmol/L (ref 96–106)
Creatinine, Ser: 1.03 mg/dL — ABNORMAL HIGH (ref 0.57–1.00)
Globulin, Total: 2.3 g/dL (ref 1.5–4.5)
Glucose: 102 mg/dL — ABNORMAL HIGH (ref 70–99)
Potassium: 4.7 mmol/L (ref 3.5–5.2)
Sodium: 136 mmol/L (ref 134–144)
Total Protein: 6.7 g/dL (ref 6.0–8.5)
eGFR: 57 mL/min/{1.73_m2} — ABNORMAL LOW (ref >59)

## 2024-02-16 NOTE — Progress Notes (Signed)
 HI Alison Wilson, kidney function up slightly normally you run between 0.8 and 0.9.  This time it was 1.0 so not a big shift but just slightly up so I do want a recheck that in about 8 weeks.  Just make sure you are hydrating.  Liver function looks great.

## 2024-02-24 ENCOUNTER — Other Ambulatory Visit: Payer: Self-pay | Admitting: Family Medicine

## 2024-02-24 DIAGNOSIS — Z1231 Encounter for screening mammogram for malignant neoplasm of breast: Secondary | ICD-10-CM

## 2024-03-17 ENCOUNTER — Ambulatory Visit

## 2024-03-22 ENCOUNTER — Other Ambulatory Visit: Payer: Self-pay | Admitting: Family Medicine

## 2024-03-22 DIAGNOSIS — I10 Essential (primary) hypertension: Secondary | ICD-10-CM

## 2024-04-06 ENCOUNTER — Ambulatory Visit

## 2024-04-08 DIAGNOSIS — R7309 Other abnormal glucose: Secondary | ICD-10-CM | POA: Diagnosis not present

## 2024-04-08 DIAGNOSIS — N289 Disorder of kidney and ureter, unspecified: Secondary | ICD-10-CM | POA: Diagnosis not present

## 2024-04-08 DIAGNOSIS — I10 Essential (primary) hypertension: Secondary | ICD-10-CM | POA: Diagnosis not present

## 2024-04-09 LAB — CBC WITH DIFFERENTIAL/PLATELET
Basophils Absolute: 0.1 10*3/uL (ref 0.0–0.2)
Basos: 1 %
EOS (ABSOLUTE): 0.2 10*3/uL (ref 0.0–0.4)
Eos: 3 %
Hematocrit: 50.2 % — ABNORMAL HIGH (ref 34.0–46.6)
Hemoglobin: 16.1 g/dL — ABNORMAL HIGH (ref 11.1–15.9)
Immature Grans (Abs): 0 10*3/uL (ref 0.0–0.1)
Immature Granulocytes: 0 %
Lymphocytes Absolute: 2.9 10*3/uL (ref 0.7–3.1)
Lymphs: 36 %
MCH: 30.1 pg (ref 26.6–33.0)
MCHC: 32.1 g/dL (ref 31.5–35.7)
MCV: 94 fL (ref 79–97)
Monocytes Absolute: 1 10*3/uL — ABNORMAL HIGH (ref 0.1–0.9)
Monocytes: 12 %
Neutrophils Absolute: 4 10*3/uL (ref 1.4–7.0)
Neutrophils: 48 %
Platelets: 277 10*3/uL (ref 150–450)
RBC: 5.34 x10E6/uL — ABNORMAL HIGH (ref 3.77–5.28)
RDW: 12.3 % (ref 11.7–15.4)
WBC: 8.1 10*3/uL (ref 3.4–10.8)

## 2024-04-09 LAB — CMP14+EGFR
ALT: 12 IU/L (ref 0–32)
AST: 18 IU/L (ref 0–40)
Albumin: 4.2 g/dL (ref 3.8–4.8)
Alkaline Phosphatase: 77 IU/L (ref 44–121)
BUN/Creatinine Ratio: 12 (ref 12–28)
BUN: 12 mg/dL (ref 8–27)
Bilirubin Total: 0.5 mg/dL (ref 0.0–1.2)
CO2: 20 mmol/L (ref 20–29)
Calcium: 10 mg/dL (ref 8.7–10.3)
Chloride: 102 mmol/L (ref 96–106)
Creatinine, Ser: 0.98 mg/dL (ref 0.57–1.00)
Globulin, Total: 2.4 g/dL (ref 1.5–4.5)
Glucose: 100 mg/dL — ABNORMAL HIGH (ref 70–99)
Potassium: 4.8 mmol/L (ref 3.5–5.2)
Sodium: 138 mmol/L (ref 134–144)
Total Protein: 6.6 g/dL (ref 6.0–8.5)
eGFR: 61 mL/min/{1.73_m2} (ref >59)

## 2024-04-09 LAB — HEMOGLOBIN A1C
Est. average glucose Bld gHb Est-mCnc: 108 mg/dL
Hgb A1c MFr Bld: 5.4 % (ref 4.8–5.6)

## 2024-04-11 ENCOUNTER — Ambulatory Visit: Payer: Self-pay | Admitting: Family Medicine

## 2024-04-11 NOTE — Progress Notes (Signed)
 Hi Ethie, kidney function is stable liver enzymes are normal.  Hemoglobin is similar to your past numbers right around 16 which is a little on the high end.

## 2024-04-29 ENCOUNTER — Encounter: Payer: Self-pay | Admitting: Family Medicine

## 2024-04-29 DIAGNOSIS — I10 Essential (primary) hypertension: Secondary | ICD-10-CM

## 2024-05-03 MED ORDER — LOSARTAN POTASSIUM 50 MG PO TABS: 50.0000 mg | ORAL_TABLET | Freq: Every day | ORAL | 0 refills | Status: DC

## 2024-05-12 ENCOUNTER — Ambulatory Visit

## 2024-05-12 DIAGNOSIS — Z1231 Encounter for screening mammogram for malignant neoplasm of breast: Secondary | ICD-10-CM | POA: Diagnosis not present

## 2024-05-16 ENCOUNTER — Ambulatory Visit: Payer: Self-pay | Admitting: Family Medicine

## 2024-05-16 NOTE — Progress Notes (Signed)
 Please call patient. Normal mammogram.  Repeat in 1 year.

## 2024-05-17 ENCOUNTER — Encounter: Payer: Self-pay | Admitting: Family Medicine

## 2024-05-17 ENCOUNTER — Ambulatory Visit (INDEPENDENT_AMBULATORY_CARE_PROVIDER_SITE_OTHER): Admitting: Family Medicine

## 2024-05-17 VITALS — BP 157/78 | HR 64 | Ht 66.0 in | Wt 125.1 lb

## 2024-05-17 DIAGNOSIS — L814 Other melanin hyperpigmentation: Secondary | ICD-10-CM

## 2024-05-17 DIAGNOSIS — I10 Essential (primary) hypertension: Secondary | ICD-10-CM

## 2024-05-17 DIAGNOSIS — M549 Dorsalgia, unspecified: Secondary | ICD-10-CM | POA: Diagnosis not present

## 2024-05-17 DIAGNOSIS — E785 Hyperlipidemia, unspecified: Secondary | ICD-10-CM | POA: Diagnosis not present

## 2024-05-17 MED ORDER — MELOXICAM 15 MG PO TABS: 15.0000 mg | ORAL_TABLET | Freq: Every day | ORAL | 0 refills | Status: DC

## 2024-05-17 NOTE — Patient Instructions (Signed)
 2 week f/u with nurse for bp check. Please bring in your home bp cuff

## 2024-05-17 NOTE — Assessment & Plan Note (Addendum)
 Pressure here was elevated but blood pressures at home look fantastic.  And we have compared her home cuff to our machine and it is pretty accurate.  She is doing well with losartan  50.  She did come back in for repeat labs after the dose adjustment and is otherwise doing well.

## 2024-05-17 NOTE — Progress Notes (Signed)
 Established Patient Office Visit  Subjective  Patient ID: Alison Wilson, female    DOB: 1951/01/09  Age: 73 y.o. MRN: [id number]  Chief Complaint  Patient presents with   Hypertension    HPI  Hypertension- Pt denies chest pain, SOB, dizziness, or heart palpitations.  Taking meds as directed w/o problems.  Denies medication side effects.  She was here for nurse visit in May and brought hoe cuff.  Doing well on losartan  50mg . HOme BPS have been fantastic. Brought in log.  She is feeling good on the medication.   She is having some right mid back pain as well. She pulled out her back last week while trying to do some cleaning and moving books out of her attic.  He has tried some ice a couple of times she has also tried a couple ibuprofen as the Tylenol was not really helping.  Also has a few skin lesions she wanted me to look at that are brown spots 1 on each lower leg and then 1 on her back near her bra strap area.  She has made an appointment with dermatology in August at [facility].    ROS    Objective:     BP (!) 157/78   Pulse 64   Ht 5' 6 (1.676 m)   Wt 125 lb 1.9 oz (56.8 kg)   SpO2 98%   BMI 20.19 kg/m    Physical Exam Vitals and nursing note reviewed.  Constitutional:      Appearance: Normal appearance.  HENT:     Head: Normocephalic and atraumatic.  Eyes:     Conjunctiva/sclera: Conjunctivae normal.  Cardiovascular:     Rate and Rhythm: Normal rate and regular rhythm.  Pulmonary:     Effort: Pulmonary effort is normal.     Breath sounds: Normal breath sounds.  Musculoskeletal:     Comments: She has some mild swelling on the right side of her spine.  Tender on exam.    Skin:    General: Skin is warm and dry.  Neurological:     Mental Status: She is alert.  Psychiatric:        Mood and Affect: Mood normal.      No results found for any visits on 05/17/24.    The 10-year ASCVD risk score (Arnett DK, et al., 2019) is: 36.7%     Assessment & Plan:   Problem List Items Addressed This Visit       Cardiovascular and Mediastinum   Primary hypertension - Primary   Pressure here was elevated but blood pressures at home look fantastic.  And we have compared her home cuff to our machine and it is pretty accurate.  She is doing well with losartan  50.  She did come back in for repeat labs after the dose adjustment and is otherwise doing well.      Relevant Orders   Lipid panel     Other   Hyperlipidemia   Relevant Orders   Lipid panel   Other Visit Diagnoses       Mid back pain       Relevant Medications   meloxicam  (MOBIC ) 15 MG tablet     Solar lentigo          Mid back pain, - Given exercises to do on her own at home.  Trial of anti-inflammatory, meloxicam  for pain relief okay to overlap with Tylenol if needed.  Continue with ice.  Solar lentigo-continue to keep sun covering on.  She does have an appointment for full skin check with dermatology later in the month.  Return in about 6 months (around 11/17/2024) for Hypertension.    Dorothyann Byars, MD

## 2024-05-18 ENCOUNTER — Ambulatory Visit: Payer: Self-pay | Admitting: Family Medicine

## 2024-05-18 DIAGNOSIS — E785 Hyperlipidemia, unspecified: Secondary | ICD-10-CM

## 2024-05-18 LAB — LIPID PANEL
Chol/HDL Ratio: 3.1 ratio (ref 0.0–4.4)
Cholesterol, Total: 208 mg/dL — ABNORMAL HIGH (ref 100–199)
HDL: 68 mg/dL (ref >39)
LDL Chol Calc (NIH): 125 mg/dL — ABNORMAL HIGH (ref 0–99)
Triglycerides: 85 mg/dL (ref 0–149)
VLDL Cholesterol Cal: 15 mg/dL (ref 5–40)

## 2024-05-18 NOTE — Progress Notes (Signed)
 Hi Alison Wilson,  Your total cholesterol and LDL are mildly elevated.  LDL was down to 6912 months ago and it is up to 125.  Are You taking the Lipitor?

## 2024-05-20 ENCOUNTER — Other Ambulatory Visit: Payer: Self-pay | Admitting: Family Medicine

## 2024-05-20 MED ORDER — ROSUVASTATIN CALCIUM 20 MG PO TABS: 20.0000 mg | ORAL_TABLET | Freq: Every day | ORAL | 3 refills | Status: DC

## 2024-05-20 MED ORDER — ATORVASTATIN CALCIUM 40 MG PO TABS: 40.0000 mg | ORAL_TABLET | Freq: Every day | ORAL | 1 refills | Status: DC

## 2024-05-20 NOTE — Telephone Encounter (Signed)
 Ok new rx sent fo lipitor 40  Meds ordered this encounter  Medications   atorvastatin  (LIPITOR) 40 MG tablet    Sig: Take 1 tablet (40 mg total) by mouth at bedtime.    Dispense:  90 tablet    Refill:  1

## 2024-05-20 NOTE — Progress Notes (Signed)
 Task completed. Per MyChart msg - the patient has requested to stay on the Lipitor rx at a higher dose. The provider has sent in an updated Lipitor rx to [facility]. Patient notified via MyChart to contact her pharmacy regarding the Lipitor rx.

## 2024-05-20 NOTE — Progress Notes (Unsigned)
 OK sent over new rx for Crestor  instead of lipitor. Her calcium  was good this time.

## 2024-05-22 ENCOUNTER — Other Ambulatory Visit: Payer: Self-pay | Admitting: Family Medicine

## 2024-05-22 DIAGNOSIS — I10 Essential (primary) hypertension: Secondary | ICD-10-CM

## 2024-05-23 ENCOUNTER — Encounter: Payer: Self-pay | Admitting: Family Medicine

## 2024-05-23 DIAGNOSIS — E785 Hyperlipidemia, unspecified: Secondary | ICD-10-CM

## 2024-05-26 ENCOUNTER — Encounter: Payer: Self-pay | Admitting: Family Medicine

## 2024-05-26 MED ORDER — ATORVASTATIN CALCIUM 10 MG PO TABS: 10.0000 mg | ORAL_TABLET | Freq: Every day | ORAL | 3 refills | Status: AC

## 2024-05-26 NOTE — Addendum Note (Signed)
 Addended by: Lea Baine D on: 05/26/2024 12:48 PM   Modules accepted: Orders

## 2024-05-31 ENCOUNTER — Ambulatory Visit

## 2024-06-14 ENCOUNTER — Encounter: Payer: Self-pay | Admitting: Sports Medicine

## 2024-06-15 ENCOUNTER — Ambulatory Visit (INDEPENDENT_AMBULATORY_CARE_PROVIDER_SITE_OTHER)

## 2024-06-15 VITALS — BP 133/71 | HR 77 | Ht 66.0 in

## 2024-06-15 DIAGNOSIS — I10 Essential (primary) hypertension: Secondary | ICD-10-CM | POA: Diagnosis not present

## 2024-06-15 NOTE — Progress Notes (Signed)
   Established Patient Office Visit  Subjective   Patient ID: Rakeisha Nyce, female    DOB: 13-Nov-1950  Age: 73 y.o. MRN: [id number]  Chief Complaint  Patient presents with   Hypertension    BP check nurse visit    HPI  Hypertension- BP check nurse visit. Patient denies chest pain, shortness of breath, dizziness, palpitation, vision changes or medication problems. Doing well with Losartan  50mg  .   ROS    Objective:     BP 133/71 Comment: patient home cuff = 108/72( states felt herself relax just before this reading)  Pulse 77 Comment: patient home BP cuff pulse = 79  Ht 5' 6 (1.676 m)   SpO2 100%   BMI 20.19 kg/m    Physical Exam   No results found for any visits on 06/15/24.    The 10-year ASCVD risk score (Arnett DK, et al., 2019) is: 29%    Assessment & Plan:  Initial BP reading -our cuff = 142/73  patient cuff = 142/78.  Second reading -our cuff= 133/71 patient cuff = 108/72 ( patient states she felt herself relax just before the second reading with her home BP cuff ) . Per DR. Metheney  continue current medication regimen and keep upcoming appt schld for 11/17/2024.  Problem List Items Addressed This Visit       Cardiovascular and Mediastinum   Primary hypertension - Primary    Return for upcoming appt already scheduled for 11/17/2024.    Suzen SHAUNNA Plenty, LPN

## 2024-06-15 NOTE — Patient Instructions (Signed)
 continue current medication regimen and keep upcoming appt schld for 11/17/2024.

## 2024-06-27 NOTE — Addendum Note (Signed)
 Addended by: BONNY JON DEL on: 06/27/2024 11:25 AM   Modules accepted: Orders

## 2024-06-28 MED ORDER — ATORVASTATIN CALCIUM 20 MG PO TABS: 20.0000 mg | ORAL_TABLET | Freq: Every day | ORAL | 3 refills | Status: AC

## 2024-06-28 NOTE — Telephone Encounter (Signed)
 Labs and med order is in

## 2024-06-28 NOTE — Addendum Note (Signed)
 Addended by: Jemimah Cressy D on: 06/28/2024 08:11 AM   Modules accepted: Orders

## 2024-07-04 DIAGNOSIS — E785 Hyperlipidemia, unspecified: Secondary | ICD-10-CM | POA: Diagnosis not present

## 2024-07-05 ENCOUNTER — Encounter: Payer: Self-pay | Admitting: Family Medicine

## 2024-07-05 ENCOUNTER — Ambulatory Visit: Payer: Self-pay | Admitting: Family Medicine

## 2024-07-05 DIAGNOSIS — R899 Unspecified abnormal finding in specimens from other organs, systems and tissues: Secondary | ICD-10-CM

## 2024-07-05 LAB — LIPID PANEL
Chol/HDL Ratio: 2.4 ratio (ref 0.0–4.4)
Cholesterol, Total: 154 mg/dL (ref 100–199)
HDL: 65 mg/dL (ref >39)
LDL Chol Calc (NIH): 76 mg/dL (ref 0–99)
Triglycerides: 65 mg/dL (ref 0–149)
VLDL Cholesterol Cal: 13 mg/dL (ref 5–40)

## 2024-07-05 LAB — CMP14+EGFR
ALT: 14 IU/L (ref 0–32)
AST: 21 IU/L (ref 0–40)
Albumin: 4.1 g/dL (ref 3.8–4.8)
Alkaline Phosphatase: 79 IU/L (ref 49–135)
BUN/Creatinine Ratio: 9 — ABNORMAL LOW (ref 12–28)
BUN: 9 mg/dL (ref 8–27)
Bilirubin Total: 0.5 mg/dL (ref 0.0–1.2)
CO2: 21 mmol/L (ref 20–29)
Calcium: 10.4 mg/dL — ABNORMAL HIGH (ref 8.7–10.3)
Chloride: 104 mmol/L (ref 96–106)
Creatinine, Ser: 0.99 mg/dL (ref 0.57–1.00)
Globulin, Total: 2.3 g/dL (ref 1.5–4.5)
Glucose: 92 mg/dL (ref 70–99)
Potassium: 5 mmol/L (ref 3.5–5.2)
Sodium: 139 mmol/L (ref 134–144)
Total Protein: 6.4 g/dL (ref 6.0–8.5)
eGFR: 60 mL/min/1.73 (ref >59)

## 2024-07-05 NOTE — Progress Notes (Signed)
 Hi Alison Wilson,  Calcium  level is stable.  Liver enzymes are normal which is great.  Kidney function looks great at 0.9.  Cholesterol and LDL look absolutely fantastic compared to last month.

## 2024-07-06 NOTE — Telephone Encounter (Signed)
 OK to repeat labs in 6 weeks but they really look better.

## 2024-07-07 ENCOUNTER — Other Ambulatory Visit: Payer: Self-pay | Admitting: Family Medicine

## 2024-07-07 DIAGNOSIS — I10 Essential (primary) hypertension: Secondary | ICD-10-CM

## 2024-07-12 NOTE — Telephone Encounter (Signed)
 NO sooner than 6 weeks. I don't think they need to be checked that soon so 6 weeks at the earlierest

## 2024-07-27 NOTE — Telephone Encounter (Signed)
 Her last lipid was 4 weeks ago. No need to reorder.  Please update pharmacy list

## 2024-08-03 ENCOUNTER — Encounter: Payer: Self-pay | Admitting: Family Medicine

## 2024-08-03 DIAGNOSIS — I10 Essential (primary) hypertension: Secondary | ICD-10-CM

## 2024-08-03 MED ORDER — LOSARTAN POTASSIUM 50 MG PO TABS: 50.0000 mg | ORAL_TABLET | Freq: Every day | ORAL | 3 refills | Status: AC

## 2024-08-03 NOTE — Telephone Encounter (Signed)
 Patient requesting to change pharmacy from [facility] to [facility].  Per protocol forwarding script to correct pharmacy. In patient note states she already cancelled with [facility]

## 2024-08-16 LAB — CBC WITH DIFFERENTIAL/PLATELET
Basophils Absolute: 0.1 x10E3/uL (ref 0.0–0.2)
Basos: 1 %
EOS (ABSOLUTE): 0.2 x10E3/uL (ref 0.0–0.4)
Eos: 3 %
Hematocrit: 48.8 % — ABNORMAL HIGH (ref 34.0–46.6)
Hemoglobin: 15.8 g/dL (ref 11.1–15.9)
Immature Grans (Abs): 0 x10E3/uL (ref 0.0–0.1)
Immature Granulocytes: 0 %
Lymphocytes Absolute: 3.3 x10E3/uL — ABNORMAL HIGH (ref 0.7–3.1)
Lymphs: 41 %
MCH: 30.3 pg (ref 26.6–33.0)
MCHC: 32.4 g/dL (ref 31.5–35.7)
MCV: 94 fL (ref 79–97)
Monocytes Absolute: 1.1 x10E3/uL — ABNORMAL HIGH (ref 0.1–0.9)
Monocytes: 14 %
Neutrophils Absolute: 3.2 x10E3/uL (ref 1.4–7.0)
Neutrophils: 41 %
Platelets: 283 x10E3/uL (ref 150–450)
RBC: 5.21 x10E6/uL (ref 3.77–5.28)
RDW: 12 % (ref 11.7–15.4)
WBC: 7.9 x10E3/uL (ref 3.4–10.8)

## 2024-08-16 LAB — CMP14+EGFR
ALT: 10 IU/L (ref 0–32)
AST: 20 IU/L (ref 0–40)
Albumin: 4.3 g/dL (ref 3.8–4.8)
Alkaline Phosphatase: 85 IU/L (ref 49–135)
BUN/Creatinine Ratio: 17 (ref 12–28)
BUN: 14 mg/dL (ref 8–27)
Bilirubin Total: 0.4 mg/dL (ref 0.0–1.2)
CO2: 21 mmol/L (ref 20–29)
Calcium: 10.3 mg/dL (ref 8.7–10.3)
Chloride: 101 mmol/L (ref 96–106)
Creatinine, Ser: 0.84 mg/dL (ref 0.57–1.00)
Globulin, Total: 2.4 g/dL (ref 1.5–4.5)
Glucose: 101 mg/dL — ABNORMAL HIGH (ref 70–99)
Potassium: 4.5 mmol/L (ref 3.5–5.2)
Sodium: 135 mmol/L (ref 134–144)
Total Protein: 6.7 g/dL (ref 6.0–8.5)
eGFR: 72 mL/min/1.73 (ref >59)

## 2024-08-16 NOTE — Progress Notes (Signed)
 Katheryn, metabolic panel looks great! Blood count is good as well.

## 2024-08-18 ENCOUNTER — Encounter: Payer: Self-pay | Admitting: Family Medicine

## 2024-08-18 ENCOUNTER — Ambulatory Visit: Admitting: Family Medicine

## 2024-08-18 VITALS — BP 182/84 | HR 70 | Ht 66.0 in | Wt 119.4 lb

## 2024-08-18 DIAGNOSIS — Z0000 Encounter for general adult medical examination without abnormal findings: Secondary | ICD-10-CM | POA: Diagnosis not present

## 2024-08-18 NOTE — Progress Notes (Signed)
 Complete physical exam  Patient: Alison Wilson    DOB: 1951-09-14 73 y.o.   MRN: [id number]  Chief Complaint  Patient presents with   Annual Exam    Subjective:    Alison Wilson is a 73 y.o. female who presents today for a complete physical exam. She reports consuming a general diet. Stay active and exercises  She generally feels fairly well. She does not have additional problems to discuss today.   Discussed the use of AI scribe software for clinical note transcription with the patient, who gave verbal consent to proceed.  History of Present Illness Alison Wilson is a 73 year old female who presents for a routine physical exam and concerns about elevated blood pressure.  Hypertension - Concern regarding elevated blood pressure, recently measured as 'a hundred and ninety something' - Experiencing distress related to high blood pressure readings - Awaiting recheck of blood pressure during current visit  Electrolyte abnormalities - History of fluctuating potassium levels - Dietary modifications include reducing intake of bananas - Potassium levels have remained stable on recent laboratory checks  Dyslipidemia - Cholesterol increased during period of regular beef liver and avocado consumption  Renal function abnormalities - Recent laboratory results showed elevated BUN and creatinine, likely secondary to dehydration during hot weather - Increased water intake has resulted in normalization of BUN and creatinine levels  Upper respiratory symptoms - Symptoms consistent with a cold, including swollen glands under the jaw and raspy voice - Uses nasal saline for relief of congestion  Physical activity - Remains physically active, including use of weights, working on property, and gardening  Sleep disturbance - Sleep occasionally disrupted by cat and recent emotional distress following the loss of her 55 year old dog  Emotional distress - Experiencing emotional impact following  the loss of her long-time companion dog   Most recent fall risk assessment:    08/18/2024    2:13 PM  Fall Risk   Falls in the past year? 0  Number falls in past yr: 0  Injury with Fall? 0  Risk for fall due to : No Fall Risks  Follow up Falls evaluation completed     Most recent depression screenings:    08/18/2024    2:13 PM 05/17/2024    8:32 AM  PHQ 2/9 Scores  PHQ - 2 Score 0 0        Patient Care Team: Alvan Alison BIRCH, MD as PCP - General (Family Medicine) Mixon, Victory DASEN (Gastroenterology)   ROS    Objective:    BP (!) 182/84   Pulse 70   Ht 5' 6 (1.676 m)   Wt 119 lb 6.4 oz (54.2 kg)   SpO2 100%   BMI 19.27 kg/m     Physical Exam Exam conducted with a chaperone present.  Constitutional:      Appearance: Normal appearance.  HENT:     Head: Normocephalic and atraumatic.     Right Ear: Tympanic membrane, ear canal and external ear normal.     Left Ear: Tympanic membrane, ear canal and external ear normal.     Nose: Nose normal.     Mouth/Throat:     Pharynx: Oropharynx is clear.  Eyes:     Extraocular Movements: Extraocular movements intact.     Conjunctiva/sclera: Conjunctivae normal.     Pupils: Pupils are equal, round, and reactive to light.  Neck:     Thyroid : No thyromegaly.  Cardiovascular:     Rate and Rhythm: Normal rate and regular rhythm.  Pulmonary:     Effort: Pulmonary effort is normal.     Breath sounds: Normal breath sounds.  Chest:     Chest wall: No mass.  Breasts:    Right: Normal. No mass, nipple discharge or skin change.     Left: Normal. No mass, nipple discharge or skin change.  Abdominal:     General: Bowel sounds are normal.     Palpations: Abdomen is soft.     Tenderness: There is no abdominal tenderness.  Musculoskeletal:        General: No swelling.     Cervical back: Neck supple.  Lymphadenopathy:     Upper Body:     Right upper body: No supraclavicular, axillary or pectoral adenopathy.     Left upper  body: No supraclavicular, axillary or pectoral adenopathy.  Skin:    General: Skin is warm and dry.  Neurological:     Mental Status: She is oriented to person, place, and time.  Psychiatric:        Mood and Affect: Mood normal.        Behavior: Behavior normal.       No results found for any visits on 08/18/24.      Assessment & Plan:    Routine Health Maintenance and Physical Exam Immunization History  Administered Date(s) Administered   Fluad Quad(high Dose 65+) 11/01/2021, 09/11/2022   INFLUENZA, HIGH DOSE SEASONAL PF 08/20/2023   Influenza,inj,Quad PF,6+ Mos 08/10/2013, 07/26/2014, 07/11/2015, 08/28/2016, 08/17/2019   Influenza-Unspecified 07/12/2024   Moderna Sars-Covid-2 Vaccination 11/26/2019, 12/24/2019, 08/11/2020, 05/01/2021   PNEUMOCOCCAL CONJUGATE-20 12/04/2021   Pfizer Covid-19 Vaccine Bivalent Booster 5y-11y 08/14/2021   Tdap 06/19/2011, 09/21/2023   Unspecified SARS-COV-2 Vaccination 09/03/2023   Zoster, Live 08/12/2011    Health Maintenance  Topic Date Due   Hepatitis C Screening  Never done   Zoster Vaccines- Shingrix (1 of 2) 07/17/1968   COVID-19 Vaccine (7 - 2025-26 season) 06/13/2024   Medicare Annual Wellness (AWV)  01/25/2025   Mammogram  05/12/2025   Colonoscopy  03/11/2026   DTaP/Tdap/Td (3 - Td or Tdap) 09/20/2033   Pneumococcal Vaccine: 50+ Years  Completed   Influenza Vaccine  Completed   DEXA SCAN  Completed   Meningococcal B Vaccine  Aged Out   Fecal DNA (Cologuard)  Discontinued    Discussed health benefits of physical activity, and encouraged her to engage in regular exercise appropriate for her age and condition.  Problem List Items Addressed This Visit   None Visit Diagnoses       Wellness examination    -  Primary       Assessment and Plan Assessment & Plan Adult Wellness Visit Routine wellness visit with normal labs and mammogram. Discussed exercise, diet, shingles vaccine, and COVID vaccine updates. - Proceed with  shingles vaccine after recovery from cold. - Review COVID vaccine updates and communicate findings. - Continue current exercise routine and dietary habits.  Essential hypertension Blood pressure elevated at 190s. Rechecked at end of visit. - Rechecked blood pressure at the end of the visit.  Acute upper respiratory infection Symptoms improving but present. Discussed saline nasal spray and Air for relief. - Continue using saline nasal spray and Air for symptom relief.   Return in about 2 weeks (around 09/01/2024) for Hypertension, Nurse BP Check.    Alison Byars, MD [facility] at [facility]

## 2024-08-18 NOTE — Patient Instructions (Signed)
Follow up in 2 weeks for bp check with nurse.

## 2024-08-20 ENCOUNTER — Encounter: Payer: Self-pay | Admitting: Family Medicine

## 2024-08-31 NOTE — Progress Notes (Unsigned)
   Subjective:    Patient ID: Alison Wilson, female    DOB: 11-21-1950, 73 y.o.   MRN: [id number]  HPI  Patient is here for a 2 wk BP check. Last OV 182/84. Pt is currently taken losartan  50mg . Denies CP, SOB, headache or vision changes  Review of Systems     Objective:   Physical Exam        Assessment & Plan:   Patients first reading is. Patients second reading

## 2024-09-01 ENCOUNTER — Ambulatory Visit (INDEPENDENT_AMBULATORY_CARE_PROVIDER_SITE_OTHER)

## 2024-09-01 VITALS — BP 116/70 | HR 80 | Resp 18 | Ht 66.0 in | Wt 119.0 lb

## 2024-09-01 DIAGNOSIS — I10 Essential (primary) hypertension: Secondary | ICD-10-CM | POA: Diagnosis not present

## 2024-11-17 ENCOUNTER — Ambulatory Visit: Admitting: Family Medicine

## 2025-02-15 ENCOUNTER — Ambulatory Visit: Admitting: Family Medicine
